# Patient Record
Sex: Male | Born: 1990 | Race: White | Hispanic: No | Marital: Single | Smoking: Current some day smoker
Health system: Southern US, Community
[De-identification: ages and names within clinical notes are randomized; demographics above are authoritative.]

## PROBLEM LIST (undated history)

## (undated) DIAGNOSIS — I319 Disease of pericardium, unspecified: Secondary | ICD-10-CM

## (undated) DIAGNOSIS — U071 COVID-19: Secondary | ICD-10-CM

---

## 2018-12-19 ENCOUNTER — Encounter: Payer: Self-pay | Admitting: Emergency Medicine

## 2018-12-19 ENCOUNTER — Emergency Department: Payer: Self-pay

## 2018-12-19 ENCOUNTER — Emergency Department
Admission: EM | Admit: 2018-12-19 | Discharge: 2018-12-19 | Disposition: A | Discharge status: Home / Self Care | Payer: Self-pay | Attending: Emergency Medicine | Admitting: Emergency Medicine

## 2018-12-19 ENCOUNTER — Other Ambulatory Visit: Payer: Self-pay

## 2018-12-19 DIAGNOSIS — Z20822 Contact with and (suspected) exposure to covid-19: Secondary | ICD-10-CM

## 2018-12-19 DIAGNOSIS — J029 Acute pharyngitis, unspecified: Secondary | ICD-10-CM

## 2018-12-19 DIAGNOSIS — R0789 Other chest pain: Secondary | ICD-10-CM

## 2018-12-19 DIAGNOSIS — B349 Viral infection, unspecified: Secondary | ICD-10-CM

## 2018-12-19 LAB — COMPREHENSIVE METABOLIC PANEL
ALT: 134 U/L — ABNORMAL HIGH (ref 0–44)
AST: 129 U/L — ABNORMAL HIGH (ref 15–41)
Albumin: 4.9 g/dL (ref 3.5–5.0)
Alkaline Phosphatase: 79 U/L (ref 38–126)
Anion gap: 19 — ABNORMAL HIGH (ref 5–15)
BUN: 11 mg/dL (ref 6–20)
CO2: 19 mmol/L — ABNORMAL LOW (ref 22–32)
Calcium: 9.3 mg/dL (ref 8.9–10.3)
Chloride: 96 mmol/L — ABNORMAL LOW (ref 98–111)
Creatinine, Ser: 0.67 mg/dL (ref 0.61–1.24)
GFR calc Af Amer: >60 mL/min (ref >60)
GFR calc non Af Amer: >60 mL/min (ref >60)
Glucose, Bld: 92 mg/dL (ref 70–99)
Potassium: 4.3 mmol/L (ref 3.5–5.1)
Sodium: 134 mmol/L — ABNORMAL LOW (ref 135–145)
Total Bilirubin: 1.1 mg/dL (ref 0.3–1.2)
Total Protein: 8.5 g/dL — ABNORMAL HIGH (ref 6.5–8.1)

## 2018-12-19 LAB — CBC WITH DIFFERENTIAL/PLATELET
Abs Immature Granulocytes: 0.02 10*3/uL (ref 0.00–0.07)
Basophils Absolute: 0 10*3/uL (ref 0.0–0.1)
Basophils Relative: 1 %
Eosinophils Absolute: 0 10*3/uL (ref 0.0–0.5)
Eosinophils Relative: 0 %
HCT: 46.4 % (ref 39.0–52.0)
Hemoglobin: 16.2 g/dL (ref 13.0–17.0)
Immature Granulocytes: 0 %
Lymphocytes Relative: 4 %
Lymphs Abs: 0.3 10*3/uL — ABNORMAL LOW (ref 0.7–4.0)
MCH: 32.4 pg (ref 26.0–34.0)
MCHC: 34.9 g/dL (ref 30.0–36.0)
MCV: 92.8 fL (ref 80.0–100.0)
Monocytes Absolute: 0 10*3/uL — ABNORMAL LOW (ref 0.1–1.0)
Monocytes Relative: 1 %
Neutro Abs: 6 10*3/uL (ref 1.7–7.7)
Neutrophils Relative %: 94 %
Platelets: 223 10*3/uL (ref 150–400)
RBC: 5 MIL/uL (ref 4.22–5.81)
RDW: 13.2 % (ref 11.5–15.5)
WBC: 6.3 10*3/uL (ref 4.0–10.5)
nRBC: 0 % (ref 0.0–0.2)

## 2018-12-19 LAB — TSH: TSH: 0.777 u[IU]/mL (ref 0.350–4.500)

## 2018-12-19 LAB — FIBRIN DERIVATIVES D-DIMER (ARMC ONLY): Fibrin derivatives D-dimer (ARMC): 252.6 ng/mL (FEU) (ref 0.00–499.00)

## 2018-12-19 LAB — LIPASE, BLOOD: Lipase: 28 U/L (ref 11–51)

## 2018-12-19 LAB — T4, FREE: Free T4: 0.76 ng/dL (ref 0.61–1.12)

## 2018-12-19 MED ORDER — METHYLPREDNISOLONE SODIUM SUCC 125 MG IJ SOLR
125.0000 mg | Freq: Once | INTRAMUSCULAR | Status: AC
  Administered 2018-12-19: 125 mg via INTRAVENOUS
  Filled 2018-12-19: qty 2

## 2018-12-19 MED ORDER — LORAZEPAM 2 MG/ML IJ SOLN
1.0000 mg | Freq: Once | INTRAMUSCULAR | Status: AC
  Administered 2018-12-19: 1 mg via INTRAVENOUS
  Filled 2018-12-19: qty 1

## 2018-12-19 MED ORDER — SODIUM CHLORIDE 0.9 % IV BOLUS
1000.0000 mL | Freq: Once | INTRAVENOUS | Status: AC
  Administered 2018-12-19: 1000 mL via INTRAVENOUS

## 2018-12-19 MED ORDER — PANTOPRAZOLE SODIUM 40 MG IV SOLR
40.0000 mg | Freq: Once | INTRAVENOUS | Status: AC
  Administered 2018-12-19: 40 mg via INTRAVENOUS
  Filled 2018-12-19: qty 40

## 2018-12-19 MED ORDER — METHYLPREDNISOLONE SODIUM SUCC 125 MG IJ SOLR
INTRAMUSCULAR | Status: AC
  Filled 2018-12-19: qty 2

## 2018-12-19 MED ORDER — METOPROLOL TARTRATE 5 MG/5ML IV SOLN
5.0000 mg | Freq: Once | INTRAVENOUS | Status: AC
  Administered 2018-12-19: 5 mg via INTRAVENOUS
  Filled 2018-12-19: qty 5

## 2018-12-19 MED ORDER — ONDANSETRON 4 MG PO TBDP: 4.0000 mg | ORAL_TABLET | Freq: Three times a day (TID) | ORAL | 0 refills | Status: DC | PRN

## 2018-12-19 MED ORDER — ONDANSETRON HCL 4 MG/2ML IJ SOLN
4.0000 mg | Freq: Once | INTRAMUSCULAR | Status: AC
  Administered 2018-12-19: 19:00:00 4 mg via INTRAVENOUS
  Filled 2018-12-19: qty 2

## 2018-12-19 MED ORDER — HYDROXYZINE HCL 25 MG PO TABS: 25.0000 mg | ORAL_TABLET | Freq: Three times a day (TID) | ORAL | 0 refills | Status: AC | PRN

## 2018-12-19 MED ORDER — LIDOCAINE VISCOUS HCL 2 % MT SOLN
15.0000 mL | Freq: Once | OROMUCOSAL | Status: AC
  Administered 2018-12-19: 15 mL via OROMUCOSAL
  Filled 2018-12-19: qty 15

## 2018-12-19 MED ORDER — FAMOTIDINE 20 MG PO TABS: 20.0000 mg | ORAL_TABLET | Freq: Two times a day (BID) | ORAL | 0 refills | Status: DC

## 2018-12-19 MED ORDER — PREDNISONE 10 MG (21) PO TBPK: ORAL_TABLET | ORAL | 0 refills | Status: DC

## 2018-12-19 MED ORDER — LIDOCAINE VISCOUS HCL 2 % MT SOLN: 20.0000 mL | OROMUCOSAL | 0 refills | Status: DC | PRN

## 2018-12-19 MED ORDER — KETOROLAC TROMETHAMINE 30 MG/ML IJ SOLN
15.0000 mg | INTRAMUSCULAR | Status: AC
  Administered 2018-12-19: 15 mg via INTRAVENOUS
  Filled 2018-12-19: qty 1

## 2018-12-19 NOTE — ED Triage Notes (Signed)
Pr reports was sitting at home eating a bag of chips and all of a sudden he felt like his throat was going to close up. Pt reports has eaten the same chip before and no new soaps, foods, clothes, etc. Pt states hx of anxiety.

## 2018-12-19 NOTE — ED Notes (Signed)
EDP Stafford notified pt remains anxious and nauseous post meds. Awaiting orders.

## 2018-12-19 NOTE — ED Notes (Signed)
EDP Stafford notified pt requesting something for nausea.

## 2018-12-19 NOTE — ED Triage Notes (Signed)
Pt comes into the ED via EMS from the [facility] parking lot with c/o feeling like his throat was closing up, no noted angioedema or rash noted. Pt has a hx of anxiety.

## 2018-12-19 NOTE — ED Notes (Signed)
Pt reports CP shifting between R and L side of chest. Pt requesting something for his CP. [place] notified. Verbal from [facility] that he will place orders for new meds as needed once imaging/bloodwork resulted.

## 2018-12-19 NOTE — ED Notes (Addendum)
See triage note. Pt denies allergies. States had covid months ago. Reports nausea/discomfort while breathing/difficulty swallowing for last 5 hours. Is currently clearing throat every 30 or so seconds. Resp unlabored/regular. A&Ox4. Pt anxious; states history of anxiety; denies taking any meds for anxiety.

## 2018-12-19 NOTE — ED Notes (Signed)
Urine sample sent to lab

## 2018-12-19 NOTE — ED Provider Notes (Signed)
[facility] Emergency Department Provider Note  ____________________________________________  Time seen: Approximately 6:39 PM  I have reviewed the triage vital signs and the nursing notes.   HISTORY  Chief Complaint Oral Swelling and Allergic Reaction    HPI Rodney Smith is a 28 y.o. male who reports being in his usual state of health until this afternoon when he was eating a bag of chips.  During that time, he felt like he had feeling of throat swelling and difficulty swallowing which was constant and worsening.  No shortness of breath.  He called EMS who brought him to the emergency department.  Denies any rash, states he did vomit once.  No known medication or food allergies.  Symptoms have been ongoing for the past 5 hours, cough unchanging.  No aggravating or alleviating factors.     History reviewed. No pertinent past medical history.   There are no problems to display for this patient.    History reviewed. No pertinent surgical history.   Prior to Admission medications   Medication Sig Start Date End Date Taking? Authorizing Provider  famotidine (PEPCID) 20 MG tablet Take 1 tablet (20 mg total) by mouth 2 (two) times daily. 12/19/18   Sharman Cheek, MD  hydrOXYzine (ATARAX/VISTARIL) 25 MG tablet Take 1 tablet (25 mg total) by mouth every 8 (eight) hours as needed for up to 5 days for anxiety. 12/19/18 12/24/18  Sharman Cheek, MD  lidocaine (XYLOCAINE) 2 % solution Use as directed 20 mLs in the mouth or throat every 2 (two) hours as needed for mouth pain. Gargle and spit out 12/19/18   Sharman Cheek, MD  ondansetron (ZOFRAN ODT) 4 MG disintegrating tablet Take 1 tablet (4 mg total) by mouth every 8 (eight) hours as needed for nausea or vomiting. 12/19/18   Sharman Cheek, MD  predniSONE (STERAPRED UNI-PAK 21 TAB) 10 MG (21) TBPK tablet 6 tablets on day 1, then 5 tablets on day 2, then 4 tablets on day 3, then 3 tablets on day 4,  then 2 tablets on day 5, then 1 tablet on day 6. 12/19/18   Sharman Cheek, MD  None   Allergies Patient has no allergy information on record.   No family history on file.  Social History Social History   Tobacco Use  . Smoking status: Not on file  Substance Use Topics  . Alcohol use: Not on file  . Drug use: Not on file  No significant alcohol or drug use.  Review of Systems  Constitutional:   No fever or chills.  ENT:   Positive sore throat.Marland Kitchen No rhinorrhea. Cardiovascular:   No chest pain or syncope. Respiratory:   No dyspnea or cough. Gastrointestinal:   Negative for abdominal pain, positive vomiting. Musculoskeletal:   Negative for focal pain or swelling All other systems reviewed and are negative except as documented above in ROS and HPI.  ____________________________________________   PHYSICAL EXAM:  VITAL SIGNS: ED Triage Vitals  Enc Vitals Group     BP 12/19/18 1658 (!) 139/103     Pulse Rate 12/19/18 1658 (!) 112     Resp 12/19/18 1658 20     Temp 12/19/18 1658 98 F (36.7 C)     Temp Source 12/19/18 1658 Oral     SpO2 12/19/18 1658 100 %     Weight 12/19/18 1655 170 lb (77.1 kg)     Height 12/19/18 1655 6' (1.829 m)     Head Circumference --  Peak Flow --      Pain Score 12/19/18 1655 0     Pain Loc --      Pain Edu? --      Excl. in [place]? --     Vital signs reviewed, nursing assessments reviewed.   Constitutional:   Alert and oriented. Non-toxic appearance.  Anxious appearing. Eyes:   Conjunctivae are normal. EOMI. PERRL. ENT      Head:   Normocephalic and atraumatic.      Nose:   Normal      Mouth/Throat:   No tongue swelling or elevation.  Tonsils and uvula normal.  No oropharyngeal edema.      Neck:   No meningismus. Full ROM. Hematological/Lymphatic/Immunilogical:   No cervical lymphadenopathy. Cardiovascular:   RRR. Symmetric bilateral radial and DP pulses.  No murmurs. Cap refill less than 2 seconds. Respiratory:   Normal  respiratory effort without tachypnea/retractions. Breath sounds are clear and equal bilaterally. No wheezes/rales/rhonchi. Gastrointestinal:   Soft and nontender. Non distended. There is no CVA tenderness.  No rebound, rigidity, or guarding. Musculoskeletal:   Normal range of motion in all extremities. No joint effusions.  No lower extremity tenderness.  No edema. Neurologic:   Normal speech and language.  Motor grossly intact. No acute focal neurologic deficits are appreciated.  Skin:    Skin is warm, dry and intact. No rash noted.  No petechiae, purpura, or bullae.  ____________________________________________    LABS (pertinent positives/negatives) (all labs ordered are listed, but only abnormal results are displayed) Labs Reviewed  COMPREHENSIVE METABOLIC PANEL - Abnormal; Notable for the following components:      Result Value   Sodium 134 (*)    Chloride 96 (*)    CO2 19 (*)    Total Protein 8.5 (*)    AST 129 (*)    ALT 134 (*)    Anion gap 19 (*)    All other components within normal limits  CBC WITH DIFFERENTIAL/PLATELET - Abnormal; Notable for the following components:   Lymphs Abs 0.3 (*)    Monocytes Absolute 0.0 (*)    All other components within normal limits  LIPASE, BLOOD  FIBRIN DERIVATIVES D-DIMER ([facility] ONLY)  T4, FREE  TSH   ____________________________________________   EKG    ____________________________________________    RADIOLOGY  DG Neck Soft Tissue  Result Date: 12/19/2018 CLINICAL DATA:  Pharyngitis with pain with swallowing after eating chips. EXAM: NECK SOFT TISSUES - 1+ VIEW COMPARISON:  None. FINDINGS: The soft tissues of the neck have normal appearances, as do the regional bones and airways period no radiopaque foreign body. No soft tissue gas. IMPRESSION: Normal examination. Electronically Signed   By: Claudie Revering M.D.   On: 12/19/2018 18:49   DG Chest Portable 1 View  Result Date: 12/19/2018 CLINICAL DATA:  Chest pain and  tachycardia EXAM: PORTABLE CHEST 1 VIEW COMPARISON:  December 09, 2008 FINDINGS: Lungs are clear. Heart size and pulmonary vascularity are normal. No adenopathy. No pneumothorax. No bone lesions. IMPRESSION: No abnormality noted. Electronically Signed   By: Lowella Grip III M.D.   On: 12/19/2018 20:49    ____________________________________________   PROCEDURES Procedures  ____________________________________________    CLINICAL IMPRESSION / ASSESSMENT AND PLAN / ED COURSE  Medications ordered in the ED: Medications  ketorolac (TORADOL) 30 MG/ML injection 15 mg (has no administration in time range)  methylPREDNISolone sodium succinate (SOLU-MEDROL) 125 mg/2 mL injection 125 mg ( Intravenous Not Given 12/19/18 1827)  pantoprazole (PROTONIX) injection 40  mg (40 mg Intravenous Given 12/19/18 1851)  lidocaine (XYLOCAINE) 2 % viscous mouth solution 15 mL (15 mLs Mouth/Throat Given 12/19/18 1842)  LORazepam (ATIVAN) injection 1 mg (1 mg Intravenous Given 12/19/18 1843)  ondansetron (ZOFRAN) injection 4 mg (4 mg Intravenous Given 12/19/18 1842)  LORazepam (ATIVAN) injection 1 mg (1 mg Intravenous Given 12/19/18 1941)  metoprolol tartrate (LOPRESSOR) injection 5 mg (5 mg Intravenous Given 12/19/18 1950)  sodium chloride 0.9 % bolus 1,000 mL (1,000 mLs Intravenous New Bag/Given 12/19/18 2043)    Pertinent labs & imaging results that were available during my care of the patient were reviewed by me and considered in my medical decision making (see chart for details).  Rodney Smith was evaluated in Emergency Department on 12/19/2018 for the symptoms described in the history of present illness. He was evaluated in the context of the global COVID-19 pandemic, which necessitated consideration that the patient might be at risk for infection with the SARS-CoV-2 virus that causes COVID-19. Institutional protocols and algorithms that pertain to the evaluation of patients at risk for COVID-19 are in  a state of rapid change based on information released by regulatory bodies including the CDC and federal and state organizations. These policies and algorithms were followed during the patient's care in the ED.   Patient presents with feeling of sore throat and throat tightening.  Anxious appearing.  Not in distress, vital signs unremarkable.  On my exam he is not tachycardic.  Presentation is not consistent with an allergic reaction given absence of skin findings or wheezing or swelling.  I doubt PTA or RPA or vascular occlusion or dissection.  I suspect pharyngeal abrasion from chips which may have been precipitated an anxiety attack.  Neck x-ray imaging viewed by me, appears normal without any appreciable swelling of the upper airway or epiglottis, no evidence of foreign body.  We will treat the patient symptomatically with viscous lidocaine, an acids, Zofran for nausea, and Ativan for anxiety.  I think he will be stable for discharge home.  Clinical Course as of Dec 18 2140  Mon Dec 19, 2018  2130 Patient reports persistent discomfort. Also now complaining of anterior chest pain that is reproducible and tender to the touch over the anterior ribs. His pain is noncardiac. Lab panel obtained which shows normal thyroid studies, normal D-dimer. Slightly elevated LFTs with lymphocytopenia. Questionable viral syndrome, possibly Covid.   [PS]  2131 Chest x-ray unremarkable. I will give NSAIDs, recommend Covid test, plan for discharge home.   [PS]  2132 Patient has a slightly decreased CO2 level on chemistry panel which is causing a mildly elevated anion gap. I think this is due to respiratory alkalosis from his hyperventilation and anxiety. Patient's been given IV fluids for hydration.   [PS]    Clinical Course User Index [PS] Sharman Cheek, MD     ----------------------------------------- 9:41 PM on 12/19/2018 -----------------------------------------  Offered patient reassurance based on  negative work-up.  His friend at bedside notes that the patient had a brief febrile illness at home 5 days ago with night sweats and chills.  In this context, the presentation is clinically suspicious for viral syndrome due to COVID-19.  The patient previously had Covid 5 months ago, is currently nontoxic with normal oxygenation so repeat testing at this time is not mandatory.  Recommended home care, fluids NSAIDs Zofran, steroid taper, hydroxyzine for anxiety.  Will give Toradol IV 50 mg in the ED while finishing his IV fluids prior to discharge.  ____________________________________________  FINAL CLINICAL IMPRESSION(S) / ED DIAGNOSES    Final diagnoses:  Sore throat  Viral syndrome  Chest wall pain  Suspected COVID-19 virus infection     ED Discharge Orders         Ordered    predniSONE (STERAPRED UNI-PAK 21 TAB) 10 MG (21) TBPK tablet     12/19/18 2140    famotidine (PEPCID) 20 MG tablet  2 times daily     12/19/18 2140    ondansetron (ZOFRAN ODT) 4 MG disintegrating tablet  Every 8 hours PRN     12/19/18 2140    lidocaine (XYLOCAINE) 2 % solution  Every 2 hours PRN     12/19/18 2140    hydrOXYzine (ATARAX/VISTARIL) 25 MG tablet  Every 8 hours PRN     12/19/18 2140          Portions of this note were generated with dragon dictation software. Dictation errors may occur despite best attempts at proofreading.   Sharman CheekStafford, Jung Yurchak, MD 12/19/18 [phone]

## 2018-12-19 NOTE — ED Notes (Signed)
This RN to bedside with meds but pt already off unit for imaging. Will give meds once pt back.

## 2019-05-12 ENCOUNTER — Inpatient Hospital Stay
Admission: EM | Admit: 2019-05-12 | Discharge: 2019-05-13 | DRG: 894 | Discharge status: Against Medical Advice | Payer: Medicaid Other | Attending: Internal Medicine | Admitting: Internal Medicine

## 2019-05-12 ENCOUNTER — Emergency Department: Payer: Self-pay

## 2019-05-12 ENCOUNTER — Other Ambulatory Visit: Payer: Self-pay

## 2019-05-12 DIAGNOSIS — E876 Hypokalemia: Secondary | ICD-10-CM | POA: Diagnosis present

## 2019-05-12 DIAGNOSIS — F17210 Nicotine dependence, cigarettes, uncomplicated: Secondary | ICD-10-CM | POA: Diagnosis present

## 2019-05-12 DIAGNOSIS — R079 Chest pain, unspecified: Secondary | ICD-10-CM

## 2019-05-12 DIAGNOSIS — I319 Disease of pericardium, unspecified: Secondary | ICD-10-CM | POA: Diagnosis present

## 2019-05-12 DIAGNOSIS — F10939 Alcohol use, unspecified with withdrawal, unspecified: Secondary | ICD-10-CM | POA: Diagnosis present

## 2019-05-12 DIAGNOSIS — Z8616 Personal history of COVID-19: Secondary | ICD-10-CM

## 2019-05-12 DIAGNOSIS — F10231 Alcohol dependence with withdrawal delirium: Secondary | ICD-10-CM

## 2019-05-12 DIAGNOSIS — Z20822 Contact with and (suspected) exposure to covid-19: Secondary | ICD-10-CM | POA: Diagnosis present

## 2019-05-12 DIAGNOSIS — R0789 Other chest pain: Secondary | ICD-10-CM | POA: Diagnosis present

## 2019-05-12 DIAGNOSIS — F10239 Alcohol dependence with withdrawal, unspecified: Principal | ICD-10-CM | POA: Diagnosis present

## 2019-05-12 DIAGNOSIS — Z8249 Family history of ischemic heart disease and other diseases of the circulatory system: Secondary | ICD-10-CM

## 2019-05-12 DIAGNOSIS — F10931 Alcohol use, unspecified with withdrawal delirium: Secondary | ICD-10-CM

## 2019-05-12 DIAGNOSIS — Z5329 Procedure and treatment not carried out because of patient's decision for other reasons: Secondary | ICD-10-CM | POA: Diagnosis present

## 2019-05-12 HISTORY — DX: COVID-19: U07.1

## 2019-05-12 HISTORY — DX: Disease of pericardium, unspecified: I31.9

## 2019-05-12 LAB — ETHANOL: Alcohol, Ethyl (B): 41 mg/dL — ABNORMAL HIGH (ref <10)

## 2019-05-12 LAB — BASIC METABOLIC PANEL
Anion gap: 16 — ABNORMAL HIGH (ref 5–15)
BUN: 10 mg/dL (ref 6–20)
CO2: 22 mmol/L (ref 22–32)
Calcium: 9.6 mg/dL (ref 8.9–10.3)
Chloride: 96 mmol/L — ABNORMAL LOW (ref 98–111)
Creatinine, Ser: 0.82 mg/dL (ref 0.61–1.24)
GFR calc Af Amer: >60 mL/min (ref >60)
GFR calc non Af Amer: >60 mL/min (ref >60)
Glucose, Bld: 94 mg/dL (ref 70–99)
Potassium: 3.2 mmol/L — ABNORMAL LOW (ref 3.5–5.1)
Sodium: 134 mmol/L — ABNORMAL LOW (ref 135–145)

## 2019-05-12 LAB — CBC
HCT: 48.8 % (ref 39.0–52.0)
Hemoglobin: 17.2 g/dL — ABNORMAL HIGH (ref 13.0–17.0)
MCH: 32.7 pg (ref 26.0–34.0)
MCHC: 35.2 g/dL (ref 30.0–36.0)
MCV: 92.8 fL (ref 80.0–100.0)
Platelets: 228 10*3/uL (ref 150–400)
RBC: 5.26 MIL/uL (ref 4.22–5.81)
RDW: 14.4 % (ref 11.5–15.5)
WBC: 7.2 10*3/uL (ref 4.0–10.5)
nRBC: 0 % (ref 0.0–0.2)

## 2019-05-12 LAB — URINE DRUG SCREEN, QUALITATIVE (ARMC ONLY)
Amphetamines, Ur Screen: NOT DETECTED
Barbiturates, Ur Screen: NOT DETECTED
Benzodiazepine, Ur Scrn: NOT DETECTED
Cannabinoid 50 Ng, Ur ~~LOC~~: NOT DETECTED
Cocaine Metabolite,Ur ~~LOC~~: NOT DETECTED
MDMA (Ecstasy)Ur Screen: NOT DETECTED
Methadone Scn, Ur: NOT DETECTED
Opiate, Ur Screen: NOT DETECTED
Phencyclidine (PCP) Ur S: NOT DETECTED
Tricyclic, Ur Screen: NOT DETECTED

## 2019-05-12 LAB — TROPONIN I (HIGH SENSITIVITY): Troponin I (High Sensitivity): <2 ng/L (ref <18)

## 2019-05-12 MED ORDER — LORAZEPAM 2 MG/ML IJ SOLN
1.0000 mg | Freq: Once | INTRAMUSCULAR | Status: AC
  Administered 2019-05-12: 23:00:00 1 mg via INTRAVENOUS
  Filled 2019-05-12: qty 1

## 2019-05-12 MED ORDER — SODIUM CHLORIDE 0.9 % IV SOLN: Freq: Once | INTRAVENOUS | Status: AC

## 2019-05-12 MED ORDER — KETOROLAC TROMETHAMINE 30 MG/ML IJ SOLN
30.0000 mg | Freq: Once | INTRAMUSCULAR | Status: AC
  Administered 2019-05-12: 30 mg via INTRAVENOUS
  Filled 2019-05-12: qty 1

## 2019-05-12 MED ORDER — FAMOTIDINE IN NACL 20-0.9 MG/50ML-% IV SOLN
20.0000 mg | Freq: Once | INTRAVENOUS | Status: AC
  Administered 2019-05-12: 20 mg via INTRAVENOUS
  Filled 2019-05-12: qty 50

## 2019-05-12 MED ORDER — LORAZEPAM 2 MG/ML IJ SOLN
1.0000 mg | Freq: Once | INTRAMUSCULAR | Status: AC
  Administered 2019-05-12: 1 mg via INTRAVENOUS
  Filled 2019-05-12: qty 1

## 2019-05-12 MED ORDER — LORAZEPAM 2 MG PO TABS
2.0000 mg | ORAL_TABLET | Freq: Once | ORAL | Status: AC
  Administered 2019-05-12: 2 mg via ORAL
  Filled 2019-05-12: qty 1

## 2019-05-12 MED ORDER — ONDANSETRON HCL 4 MG/2ML IJ SOLN
4.0000 mg | Freq: Once | INTRAMUSCULAR | Status: AC
  Administered 2019-05-12: 4 mg via INTRAVENOUS
  Filled 2019-05-12: qty 2

## 2019-05-12 NOTE — ED Notes (Signed)
Per pt- Approx 5 days ago went cold [place] from drinking alcohol. Pt reports he was drinking approx 12 pack of beer a day, sometimes liquor.

## 2019-05-12 NOTE — ED Provider Notes (Signed)
[facility] Emergency Department Provider Note       Time seen: ----------------------------------------- 10:59 PM on 05/12/2019 -----------------------------------------   I have reviewed the triage vital signs and the nursing notes.  HISTORY   Chief Complaint Chest Pain   HPI Rodney Smith is a 29 y.o. male with a history of pericarditis who presents to the ED for left-sided chest pain that started 10 minutes prior to arrival.  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  Also states he stopped drinking alcohol.  He is complains of 7 out of 10 pain in his chest that is sharp.  No past medical history on file.  There are no problems to display for this patient.   No past surgical history on file.  Allergies Patient has no known allergies.  Social History Social History   Tobacco Use  . Smoking status: Not on file  Substance Use Topics  . Alcohol use: Not on file  . Drug use: Not on file   Review of Systems Constitutional: Negative for fever. Cardiovascular: Positive for chest pain Respiratory: Negative for shortness of breath. Gastrointestinal: Negative for abdominal pain, vomiting and diarrhea. Musculoskeletal: Negative for back pain. Skin: Negative for rash. Neurological: Negative for headaches, focal weakness or numbness.  Positive for tremor  All systems negative/normal/unremarkable except as stated in the HPI  ____________________________________________   PHYSICAL EXAM:  VITAL SIGNS: ED Triage Vitals  Enc Vitals Group     BP 05/12/19 2136 (!) 149/124     Pulse Rate 05/12/19 2136 (!) 146     Resp 05/12/19 2136 (!) 22     Temp 05/12/19 2136 98.6 F (37 C)     Temp Source 05/12/19 2136 Oral     SpO2 05/12/19 2136 97 %     Weight 05/12/19 2130 180 lb (81.6 kg)     Height 05/12/19 2130 5\' 11"  (1.803 m)     Head Circumference --      Peak Flow --      Pain Score 05/12/19 2130 7     Pain Loc --      Pain Edu? --       Excl. in [place]? --     Constitutional: Alert and oriented.  Mild to moderate distress Eyes: Conjunctivae are normal. Normal extraocular movements. ENT      Head: Normocephalic and atraumatic.      Nose: No congestion/rhinnorhea.      Mouth/Throat: Mucous membranes are moist.      Neck: No stridor. Cardiovascular: Rapid rate, regular rhythm. No murmurs, rubs, or gallops. Respiratory: Normal respiratory effort without tachypnea nor retractions. Breath sounds are clear and equal bilaterally. No wheezes/rales/rhonchi. Gastrointestinal: Soft and nontender. Normal bowel sounds Musculoskeletal: Nontender with normal range of motion in extremities. No lower extremity tenderness nor edema. Neurologic:  Normal speech and language. No gross focal neurologic deficits are appreciated.  Resting tremors noted Skin:  Skin is warm, dry and intact. No rash noted. Psychiatric: Anxious mood and affect ____________________________________________  EKG: Interpreted by me.  Sinus tachycardia with short PR, rate is 156 bpm, possible septal infarct age-indeterminate, normal QT  ____________________________________________  ED COURSE:  As part of my medical decision making, I reviewed the following data within the electronic MEDICAL RECORD NUMBER History obtained from family if available, nursing notes, old chart and ekg, as well as notes from prior ED visits. Patient presented for chest pain, we will assess with labs and imaging as indicated at this time.   Procedures  Rodney Smith was evaluated in Emergency Department on 05/12/2019 for the symptoms described in the history of present illness. He was evaluated in the context of the global COVID-19 pandemic, which necessitated consideration that the patient might be at risk for infection with the SARS-CoV-2 virus that causes COVID-19. Institutional protocols and algorithms that pertain to the evaluation of patients at risk for COVID-19 are in a state of rapid  change based on information released by regulatory bodies including the CDC and federal and state organizations. These policies and algorithms were followed during the patient's care in the ED.  ____________________________________________   LABS (pertinent positives/negatives)  Labs Reviewed  BASIC METABOLIC PANEL - Abnormal; Notable for the following components:      Result Value   Sodium 134 (*)    Potassium 3.2 (*)    Chloride 96 (*)    Anion gap 16 (*)    All other components within normal limits  CBC - Abnormal; Notable for the following components:   Hemoglobin 17.2 (*)    All other components within normal limits  ETHANOL  URINE DRUG SCREEN, QUALITATIVE ([facility] ONLY)  TROPONIN I (HIGH SENSITIVITY)    RADIOLOGY Chest x-ray Does not reveal any acute process  ____________________________________________   DIFFERENTIAL DIAGNOSIS   Alcohol withdrawal, DTs, arrhythmia, pericarditis, MI, dehydration  FINAL ASSESSMENT AND PLAN  Severe alcohol withdrawal, anxiety   Plan: The patient had presented for chest pain and was found to be in alcohol withdrawal. Patient's labs thus far have not revealed any acute process. Patient's imaging were reassuring.  He was placed on CIWA protocol, given oral and IV Ativan with improvement in his symptoms.  Final disposition is pending at this time.   Laurence Aly, MD    Note: This note was generated in part or whole with voice recognition software. Voice recognition is usually quite accurate but there are transcription errors that can and very often do occur. I apologize for any typographical errors that were not detected and corrected.     Earleen Newport, MD 05/12/19 2302

## 2019-05-12 NOTE — ED Triage Notes (Signed)
Patient reports left sided chest pain that started approximately 10 minutes prior to arrival.  Patient reports history of chronic pericarditis.

## 2019-05-13 ENCOUNTER — Other Ambulatory Visit: Payer: Self-pay

## 2019-05-13 ENCOUNTER — Encounter: Payer: Self-pay | Admitting: Internal Medicine

## 2019-05-13 DIAGNOSIS — F10230 Alcohol dependence with withdrawal, uncomplicated: Secondary | ICD-10-CM

## 2019-05-13 DIAGNOSIS — F10939 Alcohol use, unspecified with withdrawal, unspecified: Secondary | ICD-10-CM | POA: Diagnosis present

## 2019-05-13 DIAGNOSIS — R0789 Other chest pain: Secondary | ICD-10-CM | POA: Diagnosis present

## 2019-05-13 DIAGNOSIS — F10239 Alcohol dependence with withdrawal, unspecified: Secondary | ICD-10-CM | POA: Diagnosis present

## 2019-05-13 LAB — CBC
HCT: 39.2 % (ref 39.0–52.0)
Hemoglobin: 14.2 g/dL (ref 13.0–17.0)
MCH: 33.2 pg (ref 26.0–34.0)
MCHC: 36.2 g/dL — ABNORMAL HIGH (ref 30.0–36.0)
MCV: 91.6 fL (ref 80.0–100.0)
Platelets: 173 10*3/uL (ref 150–400)
RBC: 4.28 MIL/uL (ref 4.22–5.81)
RDW: 14.6 % (ref 11.5–15.5)
WBC: 5.7 10*3/uL (ref 4.0–10.5)
nRBC: 0 % (ref 0.0–0.2)

## 2019-05-13 LAB — COMPREHENSIVE METABOLIC PANEL
ALT: 87 U/L — ABNORMAL HIGH (ref 0–44)
AST: 82 U/L — ABNORMAL HIGH (ref 15–41)
Albumin: 4 g/dL (ref 3.5–5.0)
Alkaline Phosphatase: 80 U/L (ref 38–126)
Anion gap: 8 (ref 5–15)
BUN: 13 mg/dL (ref 6–20)
CO2: 24 mmol/L (ref 22–32)
Calcium: 8.4 mg/dL — ABNORMAL LOW (ref 8.9–10.3)
Chloride: 105 mmol/L (ref 98–111)
Creatinine, Ser: 0.75 mg/dL (ref 0.61–1.24)
GFR calc Af Amer: >60 mL/min (ref >60)
GFR calc non Af Amer: >60 mL/min (ref >60)
Glucose, Bld: 93 mg/dL (ref 70–99)
Potassium: 3.9 mmol/L (ref 3.5–5.1)
Sodium: 137 mmol/L (ref 135–145)
Total Bilirubin: 1.2 mg/dL (ref 0.3–1.2)
Total Protein: 6.7 g/dL (ref 6.5–8.1)

## 2019-05-13 LAB — RESPIRATORY PANEL BY RT PCR (FLU A&B, COVID)
Influenza A by PCR: NEGATIVE
Influenza B by PCR: NEGATIVE
SARS Coronavirus 2 by RT PCR: NEGATIVE

## 2019-05-13 LAB — MAGNESIUM: Magnesium: 1.7 mg/dL (ref 1.7–2.4)

## 2019-05-13 LAB — GLUCOSE, CAPILLARY: Glucose-Capillary: 101 mg/dL — ABNORMAL HIGH (ref 70–99)

## 2019-05-13 LAB — PHOSPHORUS: Phosphorus: 3.9 mg/dL (ref 2.5–4.6)

## 2019-05-13 LAB — SEDIMENTATION RATE: Sed Rate: 2 mm/hr (ref 0–15)

## 2019-05-13 LAB — HIV ANTIBODY (ROUTINE TESTING W REFLEX): HIV Screen 4th Generation wRfx: NONREACTIVE

## 2019-05-13 LAB — TROPONIN I (HIGH SENSITIVITY): Troponin I (High Sensitivity): <2 ng/L (ref <18)

## 2019-05-13 MED ORDER — POTASSIUM CHLORIDE 20 MEQ PO PACK
40.0000 meq | PACK | Freq: Once | ORAL | Status: AC
  Administered 2019-05-13: 40 meq via ORAL
  Filled 2019-05-13: qty 2

## 2019-05-13 MED ORDER — ACETAMINOPHEN 325 MG PO TABS
650.0000 mg | ORAL_TABLET | Freq: Four times a day (QID) | ORAL | Status: DC
  Administered 2019-05-13: 11:00:00 650 mg via ORAL
  Filled 2019-05-13: qty 2

## 2019-05-13 MED ORDER — ACETAMINOPHEN 325 MG PO TABS: 650.0000 mg | ORAL_TABLET | Freq: Four times a day (QID) | ORAL | Status: DC | PRN

## 2019-05-13 MED ORDER — POTASSIUM CHLORIDE IN NACL 20-0.9 MEQ/L-% IV SOLN
INTRAVENOUS | Status: DC
  Filled 2019-05-13 (×6): qty 1000

## 2019-05-13 MED ORDER — LORAZEPAM 1 MG PO TABS
1.0000 mg | ORAL_TABLET | ORAL | Status: DC | PRN
  Administered 2019-05-13 (×2): 1 mg via ORAL
  Administered 2019-05-13 (×2): 2 mg via ORAL
  Filled 2019-05-13 (×2): qty 2
  Filled 2019-05-13 (×2): qty 1

## 2019-05-13 MED ORDER — ONDANSETRON HCL 4 MG PO TABS: 4.0000 mg | ORAL_TABLET | Freq: Four times a day (QID) | ORAL | Status: DC | PRN

## 2019-05-13 MED ORDER — KETOROLAC TROMETHAMINE 30 MG/ML IJ SOLN: 30.0000 mg | Freq: Four times a day (QID) | INTRAMUSCULAR | Status: DC | PRN

## 2019-05-13 MED ORDER — ACETAMINOPHEN 650 MG RE SUPP: 650.0000 mg | Freq: Four times a day (QID) | RECTAL | Status: DC | PRN

## 2019-05-13 MED ORDER — HYDROCODONE-ACETAMINOPHEN 5-325 MG PO TABS
1.0000 | ORAL_TABLET | ORAL | Status: DC | PRN
  Administered 2019-05-13: 2 via ORAL
  Filled 2019-05-13: qty 2

## 2019-05-13 MED ORDER — OXYCODONE HCL 5 MG PO TABS
10.0000 mg | ORAL_TABLET | ORAL | Status: DC | PRN
  Administered 2019-05-13: 10 mg via ORAL
  Filled 2019-05-13: qty 2

## 2019-05-13 MED ORDER — METOPROLOL TARTRATE 5 MG/5ML IV SOLN: 5.0000 mg | Freq: Four times a day (QID) | INTRAVENOUS | Status: DC | PRN

## 2019-05-13 MED ORDER — KETOROLAC TROMETHAMINE 30 MG/ML IJ SOLN: 15.0000 mg | Freq: Four times a day (QID) | INTRAMUSCULAR | Status: DC | PRN

## 2019-05-13 MED ORDER — CHLORDIAZEPOXIDE HCL 25 MG PO CAPS
25.0000 mg | ORAL_CAPSULE | Freq: Four times a day (QID) | ORAL | Status: DC
  Administered 2019-05-13 (×2): 25 mg via ORAL
  Filled 2019-05-13 (×2): qty 1

## 2019-05-13 MED ORDER — OXYCODONE HCL 5 MG PO TABS: 5.0000 mg | ORAL_TABLET | ORAL | Status: DC | PRN

## 2019-05-13 MED ORDER — THIAMINE HCL 100 MG PO TABS
100.0000 mg | ORAL_TABLET | Freq: Every day | ORAL | Status: DC
  Administered 2019-05-13: 100 mg via ORAL
  Filled 2019-05-13: qty 1

## 2019-05-13 MED ORDER — LORAZEPAM 2 MG/ML IJ SOLN: 1.0000 mg | INTRAMUSCULAR | Status: DC | PRN

## 2019-05-13 MED ORDER — ONDANSETRON HCL 4 MG/2ML IJ SOLN: 4.0000 mg | Freq: Four times a day (QID) | INTRAMUSCULAR | Status: DC | PRN

## 2019-05-13 MED ORDER — ENOXAPARIN SODIUM 40 MG/0.4ML ~~LOC~~ SOLN
40.0000 mg | SUBCUTANEOUS | Status: DC
  Administered 2019-05-13: 40 mg via SUBCUTANEOUS
  Filled 2019-05-13: qty 0.4

## 2019-05-13 MED ORDER — KETOROLAC TROMETHAMINE 30 MG/ML IJ SOLN
30.0000 mg | Freq: Three times a day (TID) | INTRAMUSCULAR | Status: DC
  Administered 2019-05-13: 30 mg via INTRAVENOUS
  Filled 2019-05-13: qty 1

## 2019-05-13 MED ORDER — ADULT MULTIVITAMIN W/MINERALS CH
1.0000 | ORAL_TABLET | Freq: Every day | ORAL | Status: DC
  Administered 2019-05-13: 09:00:00 1 via ORAL
  Filled 2019-05-13: qty 1

## 2019-05-13 MED ORDER — MORPHINE SULFATE (PF) 2 MG/ML IV SOLN: 2.0000 mg | INTRAVENOUS | Status: DC | PRN

## 2019-05-13 MED ORDER — FOLIC ACID 1 MG PO TABS
1.0000 mg | ORAL_TABLET | Freq: Every day | ORAL | Status: DC
  Administered 2019-05-13: 09:00:00 1 mg via ORAL
  Filled 2019-05-13: qty 1

## 2019-05-13 MED ORDER — PREDNISONE 20 MG PO TABS
20.0000 mg | ORAL_TABLET | Freq: Every day | ORAL | Status: DC
  Administered 2019-05-13: 20 mg via ORAL
  Filled 2019-05-13: qty 1

## 2019-05-13 MED ORDER — THIAMINE HCL 100 MG/ML IJ SOLN: 100.0000 mg | Freq: Every day | INTRAMUSCULAR | Status: DC

## 2019-05-13 MED ORDER — SENNOSIDES-DOCUSATE SODIUM 8.6-50 MG PO TABS: 1.0000 | ORAL_TABLET | Freq: Every evening | ORAL | Status: DC | PRN

## 2019-05-13 NOTE — Progress Notes (Addendum)
  PROGRESS NOTE    Rodney Smith  JFH:[id number] DOB: [birth year] DOA: 05/12/2019  PCP: Patient, No Pcp Per    LOS - 0    Patient admitted overnight with acute alcohol withdrawal and complaints of chest pain.  BP was 149/124 initially, with HR 149 in the ED.  He developed pericarditis after having Covid-19 infection last year, and has intermittent episodes of chest pain since.  Negative troponin and no ischemic ECG changes.  His last drink was 2-3 days prior to admission.  Being treated with Ativan per CIWA protocol.  HR and BP now stable and within normal.  Interval subjective: RN reported patient requesting Dilaudid bc Toradol and Norco not helping, but he had not received Norco since overnight, 8 hours ago.    Exam: no acute distress, heart RRR, lungs clear, no edema, abdomen nontender nondistended with +bowel sounds  I have reviewed the full H&P by Dr. Adline Potter in detail, and I agree with the assessment and plan as outlined therein. In addition: --add Librium scheduled 25 mg PO QID, taper by one dose daily --d/c Lopressor, tachycardia resolved with treating withdrawal --decrease fluid rate, stop later if eating/drinking today --Echo ordered to make sure no pericardial effusion or other findings to explain worsened chest pain --changed pain regimen: scheduled Tylenol and Toradol, PRN oxy 5 for moderate, oxy 10 for severe, morphine for breakthrough pain only --added Prednisone 20 mg --scale back narcotics as quickly as possible   Time Spent: 20 minutes  ADDENDUM: patient left the hospital AGAINST MEDICAL ADVICE this afternoon around 3:45pm.  He was alert and fully oriented and had capacity to make this decision at the time.     Pennie Banter, DO [facility]   If 7PM-7AM, please contact night-coverage [email or web address] 05/13/2019, 8:44 AM

## 2019-05-13 NOTE — H&P (Signed)
History and Physical    Rodney Smith CNO:[id number] DOB: 10/31/1990 DOA: 05/12/2019  PCP: Patient, No Pcp Per   Patient coming from: Home  I have personally briefly reviewed patient's old medical records in [facility]  Chief Complaint: Chest pain, tremors   HPI: Rodney Smith is a 29 y.o. male with medical history significant of alcohol abuse, COVID-19 diagnosed last year and patient says that he subsequently had pericarditis which was chronic and has been having on and off chest pain since then.  Pain comes to the ER complains of on and off chest pain and because he is not able to make the pain is drinking daily.  He is drinking 12 pack of beer daily.  Last alcohol usage was 2 to 3 days ago as per the patient.  He rates the pain as 6/10 in severity sharp pain in the middle of the chest, no radiation, no greater factors, led by pain medications given in the ER.  He denies any fevers, chills.  Denies any coughing or any sputum production.  Denies any abdominal pain.  Did complain of some nausea.  No vomitings.  Denies any diarrhea or any constipation.  Denies any urinary complaints.  Patient denies any hallucinations.  ED Course: Patient was found to be tachycardic in the ER with heart rate around 140s to 150s, was given IV fluids, IV Ativan with improvement.  Review of Systems: Ten point review of systems reviewed  in detail and negative except as mentioned above in the HPI.   Past Medical History:  Diagnosis Date  . COVID-19   . Pericarditis     History reviewed. No pertinent surgical history.  Was reviewed and was noncontributory  Social History  reports that he has been smoking cigarettes. He has never used smokeless tobacco. He reports current alcohol use of about 84.0 standard drinks of alcohol per week. He reports that he does not use drugs.  No Known Allergies  Family History  Problem Relation Age of Onset  . Heart attack Father   . CAD Paternal Grandfather        Prior to Admission medications   Not on File    Physical Exam: Vitals:   05/12/19 2200 05/12/19 2230 05/12/19 2300 05/12/19 2330  BP: (!) 161/125 (!) 156/97 138/89 (!) 140/97  Pulse: (!) 116 (!) 125 (!) 109 (!) 104  Resp:  (!) 21 14 18   Temp:      TempSrc:      SpO2:  97% 96% 99%  Weight:      Height:        Constitutional: Patient lying in the bed in no acute distress, he is calm and comfortable Vitals:   05/12/19 2200 05/12/19 2230 05/12/19 2300 05/12/19 2330  BP: (!) 161/125 (!) 156/97 138/89 (!) 140/97  Pulse: (!) 116 (!) 125 (!) 109 (!) 104  Resp:  (!) 21 14 18   Temp:      TempSrc:      SpO2:  97% 96% 99%  Weight:      Height:       Eyes: PERRL, lids normal, No pallor, No icterus ENMT: Mucous membranes are moist. Posterior pharynx clear of any exudate or lesions.Normal dentition.  Neck: normal, supple, no masses, no thyromegaly Respiratory: clear to auscultation bilaterally, no wheezing, no crackles. Normal respiratory effort. No accessory muscle use.  Cardiovascular: S1 S2 Heard, rate and rhythm regular. No extremity edema. 2+ pedal pulses. No carotid bruits.  Abdomen: Soft, no tenderness, non  distended, no masses palpated. Bowel sounds positive.  Musculoskeletal: no clubbing / cyanosis. No joint deformity upper and lower extremities. Good ROM, no contractures. Normal muscle tone.  Skin: warm and dry. no rashes noted on limited skin examination. Neurologic: CN 2-12 grossly intact. Sensation intact, DTR normal. Strength 5/5 in all 4.  Tremors noted on extension of his arms/hands Psychiatric: Normal judgment and insight. Alert and oriented x 3.  Anxious   Labs on Admission: I have personally reviewed following labs and imaging studies  CBC: Recent Labs  Lab 05/12/19 2140  WBC 7.2  HGB 17.2*  HCT 48.8  MCV 92.8  PLT 937    Basic Metabolic Panel: Recent Labs  Lab 05/12/19 2140  NA 134*  K 3.2*  CL 96*  CO2 22  GLUCOSE 94  BUN 10  CREATININE 0.82   CALCIUM 9.6    GFR: Estimated Creatinine Clearance: 142.8 mL/min (by C-G formula based on SCr of 0.82 mg/dL).  Liver Function Tests: No results for input(s): AST, ALT, ALKPHOS, BILITOT, PROT, ALBUMIN in the last 168 hours.  Urine analysis: No results found for: COLORURINE, APPEARANCEUR, [place], [place], GLUCOSEU, HGBUR, BILIRUBINUR, KETONESUR, PROTEINUR, UROBILINOGEN, NITRITE, LEUKOCYTESUR  Radiological Exams on Admission: DG Chest 2 View  Result Date: 05/12/2019 CLINICAL DATA:  Left-sided chest pain for 10 minutes, history of pericarditis EXAM: CHEST - 2 VIEW COMPARISON:  12/19/2018 FINDINGS: Frontal and lateral views of the chest demonstrate an unremarkable cardiac silhouette. No airspace disease, effusion, or pneumothorax. No acute bony abnormalities. IMPRESSION: 1. No acute intrathoracic process. Electronically Signed   By: Randa Ngo M.D.   On: 05/12/2019 21:52    EKG: Independently reviewed.  Initial EKG showed sinus tachycardia at 156 bpm, with PVCs.  Assessment/Plan Active Problems:   Alcohol withdrawal (HCC)   Alcohol withdrawal syndrome, POA -We will admit to the MedSurg floor with telemetry monitoring -We will be on the CIWA protocol with Ativan -We will also be on thiamine, folic acid and multivitamins -IV fluid hydration -Patient willing to quit drinking alcohol.  He says that because of his pain is going back to drinking alcohol again.  Might benefit from outpatient pain management referral.  Chronic on and off chest pain, POA Chronic pericarditis as per the patient, POA -Hydrocodone and Toradol as needed for pain -Troponins checked in the ER were negative, no acute changes noted on EKG  Hypokalemia, POA -Potassium being replaced as needed IV   DVT prophylaxis: Subcu Lovenox Code Status: Full code Family Communication: No family available at the bedside Disposition Plan: Home Consults called: None Admission status: Inpatient, MedSurg with cardiac  monitoring  Severity of Illness: The appropriate patient status for this patient is INPATIENT. Inpatient status is judged to be reasonable and necessary in order to provide the required intensity of service to ensure the patient's safety. The patient's presenting symptoms, physical exam findings, and initial radiographic and laboratory data in the context of their chronic comorbidities is felt to place them at high risk for further clinical deterioration. Furthermore, it is not anticipated that the patient will be medically stable for discharge from the hospital within 2 midnights of admission. The following factors support the patient status of inpatient.   " The patient's presenting symptoms include chest pain, tremors, weakness. " The worrisome physical exam findings include alcohol withdrawal signs and symptoms " The initial radiographic and laboratory data are worrisome because of tachycardia noted on EKG. " The chronic co-morbidities include alcohol abuse   * I certify that at  the point of admission it is my clinical judgment that the patient will require inpatient hospital care spanning beyond 2 midnights from the point of admission due to high intensity of service, high risk for further deterioration and high frequency of surveillance required.Arelia Sneddon MD [facility]  How to contact the [facility] Attending or Consulting provider 7A - 7P or covering provider during after hours 7P -7A, for this patient?   1. Check the care team in [facility] and look for a) attending/consulting TR[facility] provider listed and b) the [facility] team listed 2. Log into [email or web address] and use [place]'s universal password to access. If you do not have the password, please contact the hospital operator. 3. Locate the [facility] provider you are looking for under [facility] and page to a number that you can be directly reached. 4. If you still have difficulty reaching the provider, please page the [facility] (Director on  Call) for the Hospitalists listed on amion for assistance.  05/13/2019, 1:05 AM

## 2019-05-13 NOTE — Progress Notes (Signed)
Rodney Smith and O x4. VSS. Pt tolerating diet well. No complaints of nausea or vomiting. IV removed intact, Patient leaving Against medical advice. Patient stated he "is fine now" he was ready to go. Patient left room walking    Vitals:   05/13/19 0747 05/13/19 1226  BP: 127/74 (!) 140/100  Pulse: 94 96  Resp: 14 20  Temp: 98.2 F (36.8 C) 98.4 F (36.9 C)  SpO2: 98% 100%    Rodney Smith

## 2019-05-14 ENCOUNTER — Other Ambulatory Visit: Payer: Self-pay

## 2019-05-14 ENCOUNTER — Encounter: Payer: Self-pay | Admitting: Emergency Medicine

## 2019-05-14 ENCOUNTER — Inpatient Hospital Stay
Admission: EM | Admit: 2019-05-14 | Discharge: 2019-05-16 | DRG: 896 | Disposition: A | Discharge status: Home / Self Care | Payer: Medicaid Other | Attending: Internal Medicine | Admitting: Internal Medicine

## 2019-05-14 DIAGNOSIS — I319 Disease of pericardium, unspecified: Secondary | ICD-10-CM | POA: Diagnosis present

## 2019-05-14 DIAGNOSIS — G9341 Metabolic encephalopathy: Secondary | ICD-10-CM | POA: Diagnosis not present

## 2019-05-14 DIAGNOSIS — F10939 Alcohol use, unspecified with withdrawal, unspecified: Secondary | ICD-10-CM | POA: Diagnosis present

## 2019-05-14 DIAGNOSIS — E876 Hypokalemia: Secondary | ICD-10-CM | POA: Diagnosis not present

## 2019-05-14 DIAGNOSIS — F17210 Nicotine dependence, cigarettes, uncomplicated: Secondary | ICD-10-CM | POA: Diagnosis present

## 2019-05-14 DIAGNOSIS — R443 Hallucinations, unspecified: Secondary | ICD-10-CM | POA: Diagnosis present

## 2019-05-14 DIAGNOSIS — R945 Abnormal results of liver function studies: Secondary | ICD-10-CM | POA: Diagnosis present

## 2019-05-14 DIAGNOSIS — F10230 Alcohol dependence with withdrawal, uncomplicated: Secondary | ICD-10-CM

## 2019-05-14 DIAGNOSIS — R7989 Other specified abnormal findings of blood chemistry: Secondary | ICD-10-CM | POA: Diagnosis present

## 2019-05-14 DIAGNOSIS — D6959 Other secondary thrombocytopenia: Secondary | ICD-10-CM | POA: Diagnosis present

## 2019-05-14 DIAGNOSIS — I610 Nontraumatic intracerebral hemorrhage in hemisphere, subcortical: Secondary | ICD-10-CM | POA: Diagnosis present

## 2019-05-14 DIAGNOSIS — F10239 Alcohol dependence with withdrawal, unspecified: Secondary | ICD-10-CM | POA: Diagnosis present

## 2019-05-14 DIAGNOSIS — G9340 Encephalopathy, unspecified: Secondary | ICD-10-CM | POA: Diagnosis present

## 2019-05-14 DIAGNOSIS — R000 Tachycardia, unspecified: Secondary | ICD-10-CM

## 2019-05-14 DIAGNOSIS — F10931 Alcohol use, unspecified with withdrawal delirium: Secondary | ICD-10-CM

## 2019-05-14 DIAGNOSIS — Z8616 Personal history of COVID-19: Secondary | ICD-10-CM

## 2019-05-14 DIAGNOSIS — B1920 Unspecified viral hepatitis C without hepatic coma: Secondary | ICD-10-CM | POA: Diagnosis present

## 2019-05-14 DIAGNOSIS — F10231 Alcohol dependence with withdrawal delirium: Principal | ICD-10-CM | POA: Diagnosis present

## 2019-05-14 LAB — URINE DRUG SCREEN, QUALITATIVE (ARMC ONLY)
Amphetamines, Ur Screen: NOT DETECTED
Barbiturates, Ur Screen: NOT DETECTED
Benzodiazepine, Ur Scrn: POSITIVE — AB
Cannabinoid 50 Ng, Ur ~~LOC~~: NOT DETECTED
Cocaine Metabolite,Ur ~~LOC~~: NOT DETECTED
MDMA (Ecstasy)Ur Screen: NOT DETECTED
Methadone Scn, Ur: NOT DETECTED
Opiate, Ur Screen: NOT DETECTED
Phencyclidine (PCP) Ur S: NOT DETECTED
Tricyclic, Ur Screen: NOT DETECTED

## 2019-05-14 LAB — COMPREHENSIVE METABOLIC PANEL
ALT: 99 U/L — ABNORMAL HIGH (ref 0–44)
AST: 94 U/L — ABNORMAL HIGH (ref 15–41)
Albumin: 4.8 g/dL (ref 3.5–5.0)
Alkaline Phosphatase: 81 U/L (ref 38–126)
Anion gap: 15 (ref 5–15)
BUN: <5 mg/dL — ABNORMAL LOW (ref 6–20)
CO2: 19 mmol/L — ABNORMAL LOW (ref 22–32)
Calcium: 9.4 mg/dL (ref 8.9–10.3)
Chloride: 106 mmol/L (ref 98–111)
Creatinine, Ser: 0.63 mg/dL (ref 0.61–1.24)
GFR calc Af Amer: >60 mL/min (ref >60)
GFR calc non Af Amer: >60 mL/min (ref >60)
Glucose, Bld: 94 mg/dL (ref 70–99)
Potassium: 3.3 mmol/L — ABNORMAL LOW (ref 3.5–5.1)
Sodium: 140 mmol/L (ref 135–145)
Total Bilirubin: 0.9 mg/dL (ref 0.3–1.2)
Total Protein: 7.7 g/dL (ref 6.5–8.1)

## 2019-05-14 LAB — CBC
HCT: 42.3 % (ref 39.0–52.0)
Hemoglobin: 14.7 g/dL (ref 13.0–17.0)
MCH: 32.5 pg (ref 26.0–34.0)
MCHC: 34.8 g/dL (ref 30.0–36.0)
MCV: 93.6 fL (ref 80.0–100.0)
Platelets: 179 10*3/uL (ref 150–400)
RBC: 4.52 MIL/uL (ref 4.22–5.81)
RDW: 14.9 % (ref 11.5–15.5)
WBC: 9.4 10*3/uL (ref 4.0–10.5)
nRBC: 0 % (ref 0.0–0.2)

## 2019-05-14 LAB — MAGNESIUM: Magnesium: 1.5 mg/dL — ABNORMAL LOW (ref 1.7–2.4)

## 2019-05-14 LAB — RESPIRATORY PANEL BY RT PCR (FLU A&B, COVID)
Influenza A by PCR: NEGATIVE
Influenza B by PCR: NEGATIVE
SARS Coronavirus 2 by RT PCR: NEGATIVE

## 2019-05-14 LAB — PHOSPHORUS: Phosphorus: 3 mg/dL (ref 2.5–4.6)

## 2019-05-14 LAB — ETHANOL: Alcohol, Ethyl (B): 102 mg/dL — ABNORMAL HIGH (ref <10)

## 2019-05-14 LAB — SALICYLATE LEVEL: Salicylate Lvl: <7 mg/dL — ABNORMAL LOW (ref 7.0–30.0)

## 2019-05-14 LAB — ACETAMINOPHEN LEVEL: Acetaminophen (Tylenol), Serum: <10 ug/mL — ABNORMAL LOW (ref 10–30)

## 2019-05-14 MED ORDER — POTASSIUM CHLORIDE CRYS ER 20 MEQ PO TBCR
40.0000 meq | EXTENDED_RELEASE_TABLET | Freq: Once | ORAL | Status: AC
  Administered 2019-05-14: 15:00:00 40 meq via ORAL
  Filled 2019-05-14: qty 2

## 2019-05-14 MED ORDER — SODIUM CHLORIDE 0.9 % IV BOLUS
1000.0000 mL | Freq: Once | INTRAVENOUS | Status: AC
  Administered 2019-05-14: 1000 mL via INTRAVENOUS

## 2019-05-14 MED ORDER — CHLORDIAZEPOXIDE HCL 10 MG PO CAPS
10.0000 mg | ORAL_CAPSULE | Freq: Three times a day (TID) | ORAL | Status: DC
  Administered 2019-05-14 – 2019-05-16 (×5): 10 mg via ORAL
  Filled 2019-05-14 (×5): qty 1

## 2019-05-14 MED ORDER — ONDANSETRON HCL 4 MG/2ML IJ SOLN: 4.0000 mg | Freq: Three times a day (TID) | INTRAMUSCULAR | Status: DC | PRN

## 2019-05-14 MED ORDER — LORAZEPAM 2 MG PO TABS: 0.0000 mg | ORAL_TABLET | Freq: Four times a day (QID) | ORAL | Status: AC

## 2019-05-14 MED ORDER — HALOPERIDOL LACTATE 5 MG/ML IJ SOLN
5.0000 mg | INTRAMUSCULAR | Status: AC
  Administered 2019-05-14: 22:00:00 5 mg via INTRAVENOUS
  Filled 2019-05-14: qty 1

## 2019-05-14 MED ORDER — SUCRALFATE 1 G PO TABS
1.0000 g | ORAL_TABLET | Freq: Three times a day (TID) | ORAL | Status: DC
  Administered 2019-05-14 – 2019-05-16 (×6): 1 g via ORAL
  Filled 2019-05-14 (×6): qty 1

## 2019-05-14 MED ORDER — IBUPROFEN 400 MG PO TABS: 200.0000 mg | ORAL_TABLET | Freq: Four times a day (QID) | ORAL | Status: DC | PRN

## 2019-05-14 MED ORDER — NICOTINE 21 MG/24HR TD PT24
21.0000 mg | MEDICATED_PATCH | Freq: Every day | TRANSDERMAL | Status: DC
  Administered 2019-05-14 – 2019-05-16 (×3): 21 mg via TRANSDERMAL
  Filled 2019-05-14 (×3): qty 1

## 2019-05-14 MED ORDER — LORAZEPAM 2 MG PO TABS: 0.0000 mg | ORAL_TABLET | Freq: Two times a day (BID) | ORAL | Status: DC

## 2019-05-14 MED ORDER — THIAMINE HCL 100 MG PO TABS
100.0000 mg | ORAL_TABLET | Freq: Every day | ORAL | Status: DC
  Administered 2019-05-15 – 2019-05-16 (×2): 100 mg via ORAL
  Filled 2019-05-14 (×2): qty 1

## 2019-05-14 MED ORDER — SODIUM CHLORIDE 0.9 % IV SOLN: INTRAVENOUS | Status: DC

## 2019-05-14 MED ORDER — LORAZEPAM 2 MG/ML IJ SOLN
0.0000 mg | Freq: Two times a day (BID) | INTRAMUSCULAR | Status: DC
  Administered 2019-05-14: 18:00:00 4 mg via INTRAVENOUS

## 2019-05-14 MED ORDER — LORAZEPAM 2 MG/ML IJ SOLN
2.0000 mg | Freq: Once | INTRAMUSCULAR | Status: AC
  Administered 2019-05-14: 2 mg via INTRAVENOUS

## 2019-05-14 MED ORDER — THIAMINE HCL 100 MG/ML IJ SOLN
100.0000 mg | Freq: Every day | INTRAMUSCULAR | Status: DC
  Administered 2019-05-14: 100 mg via INTRAVENOUS
  Filled 2019-05-14: qty 2

## 2019-05-14 MED ORDER — ENOXAPARIN SODIUM 40 MG/0.4ML ~~LOC~~ SOLN
40.0000 mg | SUBCUTANEOUS | Status: DC
  Administered 2019-05-15: 23:00:00 40 mg via SUBCUTANEOUS
  Filled 2019-05-14: qty 0.4

## 2019-05-14 MED ORDER — LORAZEPAM 2 MG/ML IJ SOLN
0.0000 mg | Freq: Four times a day (QID) | INTRAMUSCULAR | Status: AC
  Administered 2019-05-14 – 2019-05-15 (×3): 2 mg via INTRAVENOUS
  Administered 2019-05-15: 17:00:00 1 mg via INTRAVENOUS
  Administered 2019-05-15 – 2019-05-16 (×2): 2 mg via INTRAVENOUS
  Filled 2019-05-14 (×5): qty 1
  Filled 2019-05-14: qty 2
  Filled 2019-05-14 (×2): qty 1

## 2019-05-14 NOTE — ED Notes (Signed)
RN has called [facility] and informed them of patient requiring sitter. Sitter will be available after 1900. RN will try to call report again.

## 2019-05-14 NOTE — ED Notes (Signed)
TTS unable to assess at this time. 

## 2019-05-14 NOTE — ED Notes (Signed)
Lupita Leash, floor called for pt coming, reports will let Tamika know

## 2019-05-14 NOTE — ED Triage Notes (Signed)
Pt to ED via [facility], [place], pt believed to be naked. Per BD pt was talking to [place] and Jesus and bringing people back. Pt told BD that he had a "stick" that could help with transport. Pt said the stick helped him get here from [place]. Pt is cooperative at this time.

## 2019-05-14 NOTE — ED Notes (Signed)
Patient states he is seeing and talking to snakes in the top corner of the room. Patient at this time is calm, cool and collect. Patient is not aggressive and is cooperative at this time.

## 2019-05-14 NOTE — ED Notes (Signed)
Patient brought in from triage dressed out with [facility].  Patient has no belongings.

## 2019-05-14 NOTE — H&P (Addendum)
History and Physical    Rodney Smith OEV:[id number] DOB: [birth year] DOA: 05/14/2019  Referring MD/NP/PA:   PCP: Rodney Smith   Patient coming from:  The patient is coming from home.  At baseline, pt is independent for most of ADL.        Chief Complaint: Altered mental status  HPI: Rodney Smith is a 29 y.o. male with medical history significant of pericarditis after having Covid-19 infection last year, alcohol abuse, tobacco abuse, who presents with altered mental status.  Pt was admitted to hospital yesterday due to alcohol withdrawal and chest pain 2/2 possible pericarditis.  Patient left hospital AMA. Smith report, pt was found in the woood in his apartment complex, possibly naked. He was talking to Rodney Smith. Pt said the stick helped him get here from Rodney Smith.  He has hallucination and is seeing snakes in the room.  He denies suicidal or homicidal ideations.  Patient denies chest pain, cough, shortness breath, fever, chills.  No nausea, vomiting, diarrhea, abdominal pain, symptoms of UTI.  He moves all extremities normally.  ED Course: pt was found to have WBC 9.4, UDS positive for benzo, Tylenol level less than 10, salicylate level less than 7, negative COVID-19 PCR, alcohol level 102, potassium 3.3, renal function okay, temperature normal, tachycardia with heart rate of 130s, oxygen saturation 94-100% on room air, blood pressure 137/89.  Patient had a negative chest x-ray on 5/7.  Patient is placed on MedSurg bed for observation.  Psychiatry is consulted.  Review of Systems:   General: no fevers, chills, no body weight gain, has fatigue HEENT: no blurry vision, hearing changes or sore throat Respiratory: no dyspnea, coughing, wheezing CV: no chest pain, no palpitations GI: no nausea, vomiting, abdominal pain, diarrhea, constipation GU: no dysuria, burning on urination, increased urinary frequency, hematuria  Ext: no leg edema Neuro: no unilateral weakness, numbness, or  tingling, no vision change or hearing loss. Has AMS. Skin: no rash, no skin tear. MSK: No muscle spasm, no deformity, no limitation of range of movement in spin Heme: No easy bruising.  Travel history: No recent long distant travel. Psychiatry: Has hallucination  Allergy: No Known Allergies  Past Medical History:  Diagnosis Date  . COVID-19   . Pericarditis     History reviewed. No pertinent surgical history.  Social History:  reports that he has been smoking cigarettes. He has never used smokeless tobacco. He reports current alcohol use of about 84.0 standard drinks of alcohol Smith week. He reports that he does not use drugs.  Family History:  Family History  Problem Relation Age of Onset  . Heart attack Father   . CAD Paternal Grandfather      Prior to Admission medications   Not on File    Physical Exam: Vitals:   05/14/19 1344 05/14/19 1502 05/14/19 1503 05/14/19 1649  BP:  122/69 122/69 114/75  Pulse:  (!) 115 (!) 115 (!) 116  Resp:  19  19  Temp:      TempSrc:      SpO2:  100%    Weight: 81 kg     Height: 5\' 11"  (1.803 m)      General: Not in acute distress HEENT:       Eyes: PERRL, EOMI, no scleral icterus.       ENT: No discharge from the ears and nose, no pharynx injection, no tonsillar enlargement.        Neck: No JVD, no bruit, no mass felt.  Heme: No neck lymph node enlargement. Cardiac: S1/S2, RRR, No murmurs, No gallops or rubs. Respiratory: No rales, wheezing, rhonchi or rubs. GI: Soft, nondistended, nontender, no rebound pain, no organomegaly, BS present. GU: No hematuria Ext: No pitting leg edema bilaterally. 2+DP/PT pulse bilaterally. Musculoskeletal: No joint deformities, No joint redness or warmth, no limitation of ROM in spin. Skin: No rashes.  Neuro: confused, but is still oriented X3, cranial nerves II-XII grossly intact, moves all extremities normally. Psych: Patient has hallucination, no suicidal or hemocidal ideation.  Labs on  Admission: I have personally reviewed following labs and imaging studies  CBC: Recent Labs  Lab 05/12/19 2140 05/13/19 0549 05/14/19 1348  WBC 7.2 5.7 9.4  HGB 17.2* 14.2 14.7  HCT 48.8 39.2 42.3  MCV 92.8 91.6 93.6  PLT 228 173 179   Basic Metabolic Panel: Recent Labs  Lab 05/12/19 2140 05/13/19 0549 05/14/19 1348  NA 134* 137 140  K 3.2* 3.9 3.3*  CL 96* 105 106  CO2 22 24 19*  GLUCOSE 94 93 94  BUN 10 13 <5*  CREATININE 0.82 0.75 0.63  CALCIUM 9.6 8.4* 9.4  MG  --  1.7  --   PHOS  --  3.9  --    GFR: Estimated Creatinine Clearance: 146.4 mL/min (by C-G formula based on SCr of 0.63 mg/dL). Liver Function Tests: Recent Labs  Lab 05/13/19 0549 05/14/19 1348  AST 82* 94*  ALT 87* 99*  ALKPHOS 80 81  BILITOT 1.2 0.9  PROT 6.7 7.7  ALBUMIN 4.0 4.8   No results for input(s): LIPASE, AMYLASE in the last 168 hours. No results for input(s): AMMONIA in the last 168 hours. Coagulation Profile: No results for input(s): INR, PROTIME in the last 168 hours. Cardiac Enzymes: No results for input(s): CKTOTAL, CKMB, CKMBINDEX, TROPONINI in the last 168 hours. BNP (last 3 results) No results for input(s): PROBNP in the last 8760 hours. HbA1C: No results for input(s): HGBA1C in the last 72 hours. CBG: Recent Labs  Lab 05/13/19 0748  GLUCAP 101*   Lipid Profile: No results for input(s): CHOL, HDL, LDLCALC, TRIG, CHOLHDL, LDLDIRECT in the last 72 hours. Thyroid Function Tests: No results for input(s): TSH, T4TOTAL, FREET4, T3FREE, THYROIDAB in the last 72 hours. Anemia Panel: No results for input(s): VITAMINB12, FOLATE, FERRITIN, TIBC, IRON, RETICCTPCT in the last 72 hours. Urine analysis: No results found for: COLORURINE, APPEARANCEUR, LABSPEC, PHURINE, GLUCOSEU, HGBUR, BILIRUBINUR, KETONESUR, PROTEINUR, UROBILINOGEN, NITRITE, LEUKOCYTESUR Sepsis Labs: @LABRCNTIP (procalcitonin:4,lacticidven:4) ) Recent Results (from the past 240 hour(s))  Respiratory Panel by RT  PCR (Flu A&B, Covid) - Nasopharyngeal Swab     Status: None   Collection Time: 05/12/19 11:43 PM   Specimen: Nasopharyngeal Swab  Result Value Ref Range Status   SARS Coronavirus 2 by RT PCR NEGATIVE NEGATIVE Final    Comment: (NOTE) SARS-CoV-2 target nucleic acids are NOT DETECTED. The SARS-CoV-2 RNA is generally detectable in upper respiratoy specimens during the acute phase of infection. The lowest concentration of SARS-CoV-2 viral copies this assay can detect is 131 copies/mL. A negative result does not preclude SARS-Cov-2 infection and should not be used as the sole basis for treatment or other patient management decisions. A negative result may occur with  improper specimen collection/handling, submission of specimen other than nasopharyngeal swab, presence of viral mutation(s) within the areas targeted by this assay, and inadequate number of viral copies (<131 copies/mL). A negative result must be combined with clinical observations, patient history, and epidemiological information. The expected result  is Negative. Fact Sheet for Patients:  https://www.moore.com/ Fact Sheet for Healthcare Providers:  https://www.young.biz/ This test is not yet ap proved or cleared by the [place] FDA and  has been authorized for detection and/or diagnosis of SARS-CoV-2 by FDA under an Emergency Use Authorization (EUA). This EUA will remain  in effect (meaning this test can be used) for the duration of the COVID-19 declaration under Section 564(b)(1) of the Act, 21 U.S.C. section 360bbb-3(b)(1), unless the authorization is terminated or revoked sooner.    Influenza A by PCR NEGATIVE NEGATIVE Final   Influenza B by PCR NEGATIVE NEGATIVE Final    Comment: (NOTE) The Xpert Xpress SARS-CoV-2/FLU/RSV assay is intended as an aid in  the diagnosis of influenza from Nasopharyngeal swab specimens and  should not be used as a sole basis for treatment. Nasal  washings and  aspirates are unacceptable for Xpert Xpress SARS-CoV-2/FLU/RSV  testing. Fact Sheet for Patients: https://www.moore.com/ Fact Sheet for Healthcare Providers: https://www.young.biz/ This test is not yet approved or cleared by the [place] FDA and  has been authorized for detection and/or diagnosis of SARS-CoV-2 by  FDA under an Emergency Use Authorization (EUA). This EUA will remain  in effect (meaning this test can be used) for the duration of the  Covid-19 declaration under Section 564(b)(1) of the Act, 21  U.S.C. section 360bbb-3(b)(1), unless the authorization is  terminated or revoked. Performed at [facility], [address] [place], [place] [address]   Respiratory Panel by RT PCR (Flu A&B, Covid) - Nasopharyngeal Swab     Status: None   Collection Time: 05/14/19  3:05 PM   Specimen: Nasopharyngeal Swab  Result Value Ref Range Status   SARS Coronavirus 2 by RT PCR NEGATIVE NEGATIVE Final    Comment: (NOTE) SARS-CoV-2 target nucleic acids are NOT DETECTED. The SARS-CoV-2 RNA is generally detectable in upper respiratoy specimens during the acute phase of infection. The lowest concentration of SARS-CoV-2 viral copies this assay can detect is 131 copies/mL. A negative result does not preclude SARS-Cov-2 infection and should not be used as the sole basis for treatment or other patient management decisions. A negative result may occur with  improper specimen collection/handling, submission of specimen other than nasopharyngeal swab, presence of viral mutation(s) within the areas targeted by this assay, and inadequate number of viral copies (<131 copies/mL). A negative result must be combined with clinical observations, patient history, and epidemiological information. The expected result is Negative. Fact Sheet for Patients:  https://www.moore.com/ Fact Sheet for Healthcare Providers:    https://www.young.biz/ This test is not yet ap proved or cleared by the [place] FDA and  has been authorized for detection and/or diagnosis of SARS-CoV-2 by FDA under an Emergency Use Authorization (EUA). This EUA will remain  in effect (meaning this test can be used) for the duration of the COVID-19 declaration under Section 564(b)(1) of the Act, 21 U.S.C. section 360bbb-3(b)(1), unless the authorization is terminated or revoked sooner.    Influenza A by PCR NEGATIVE NEGATIVE Final   Influenza B by PCR NEGATIVE NEGATIVE Final    Comment: (NOTE) The Xpert Xpress SARS-CoV-2/FLU/RSV assay is intended as an aid in  the diagnosis of influenza from Nasopharyngeal swab specimens and  should not be used as a sole basis for treatment. Nasal washings and  aspirates are unacceptable for Xpert Xpress SARS-CoV-2/FLU/RSV  testing. Fact Sheet for Patients: https://www.moore.com/ Fact Sheet for Healthcare Providers: https://www.young.biz/ This test is not yet approved or cleared by the [place]  FDA and  has been authorized for detection and/or diagnosis of SARS-CoV-2 by  FDA under an Emergency Use Authorization (EUA). This EUA will remain  in effect (meaning this test can be used) for the duration of the  Covid-19 declaration under Section 564(b)(1) of the Act, 21  U.S.C. section 360bbb-3(b)(1), unless the authorization is  terminated or revoked. Performed at [facility], [address] [place], [place] [address]      Radiological Exams on Admission: DG Chest 2 View  Result Date: 05/12/2019 CLINICAL DATA:  Left-sided chest pain for 10 minutes, history of pericarditis EXAM: CHEST - 2 VIEW COMPARISON:  12/19/2018 FINDINGS: Frontal and lateral views of the chest demonstrate an unremarkable cardiac silhouette. No airspace disease, effusion, or pneumothorax. No acute bony abnormalities. IMPRESSION: 1. No acute  intrathoracic process. Electronically Signed   By: Sharlet Salina M.D.   On: 05/12/2019 21:52     EKG: Not done in ED, will get one.   Assessment/Plan Principal Problem:   Acute metabolic encephalopathy Active Problems:   Alcohol withdrawal (HCC)   Hypokalemia   Abnormal LFTs   Pericarditis   Acute metabolic encephalopathy: Patient has a confusion, hallucination, possibly due to alcohol withdrawal versus DTs.  Cannot rule out psychosis. -Placed on MedSurg bed for observation -Start CIWA protocol - Frequent neuro check -Consult psychiatry  Alcohol withdrawal (HCC): -CiWA protocol -IVF: 1L NS and 125 cc/h of NS -Librium 10 mg tid  Hypokalemia: K= 3.3  on admission. - Repleted - check Mg and phosphorus level  Abnormal LFTs: ALP 81, AST 94, ALT 94, total bilirubin 0.9.  Alcohol abuse has at least partially contributed. -Avoid Tylenol -Check hepatitis panel and HIV antibody  Pericarditis: Patient denies chest pain, but has tachycardia which may be due to alcohol withdrawal.  In previous admission, 2D echo was ordered, but patient left hospital on AMA -will get 2D echo -As needed ibuprofen    DVT ppx: SQ Lovenox Code Status: Full code Family Communication: not done, no family member is at bed side.     Disposition Plan: to be determined, likely to behavioral unit Consults called: Psychiatry Admission status: Med-surg bed for obs    Status is: Observation  The patient remains OBS appropriate and will d/c before 2 midnights.  Dispo: The patient is from: Home              Anticipated d/c is to: Likely behavioral unit              Anticipated d/c date is: 1 day              Patient currently is not medically stable to d/c.           Date of Service 05/14/2019    Lorretta Harp [facility]   If 7PM-7AM, please contact night-coverage [email or web address] 05/14/2019, 5:55 PM

## 2019-05-14 NOTE — ED Notes (Signed)
Patient given food tray

## 2019-05-14 NOTE — Progress Notes (Signed)
At approximately 2110 the patient became more restless and wanting to leave the floor to go smoke. Explained to the patient that he could not leave the floor to smoke and that he had a nicotine patch on to assist with his cravings. He began to discontinue the telemetry, nicotine patch and attempted to discontinue the iv.  Steward Drone Morrision informed via telephone that patient's CIWA score had increased and according to the protocol, she was to be notified. New order received to administer an additional dose of Ativan 2mg  IV, but prior to my arrival, the patient began another attempt to leave the floor and would not comply with any safety recommendations. A security alert was initiated. Security, the [facility], and [facility] arrived to the room and assisted with de escalation. See MAR. Currently the patient is resting quietly in bed with a  safety sitter at the bedside.  Will continue to monitor.

## 2019-05-14 NOTE — ED Provider Notes (Addendum)
[facility] Emergency Department Provider Note  ____________________________________________   First MD Initiated Contact with Patient 05/14/19 1404     (approximate)  I have reviewed the triage vital signs and the nursing notes.   HISTORY  Chief Complaint Hallucinations    HPI Rodney Smith is a 29 y.o. male with prior pericarditis, alcohol use who comes in with concerns for psychosis..  Patient was found in the wounds in his apartment complex possibly naked.  Per the police patient was talking to stay in [place].  Patient reported having a "stick "but could help with transport.  Patient was admitted to the hospital 2 days ago for alcoholic withdrawal but left AMA yesterday.  Patient stated that he did drink alcohol today prior to coming to the hospital.  Patient denies any other psychiatric concerns in the past.  Patient denies any SI but does report having hallucinations and seeing snakes in the room and that he is talking to the snakes.    Unable to get full HPI due to patient's psychosis.            Past Medical History:  Diagnosis Date  . COVID-19   . Pericarditis     Patient Active Problem List   Diagnosis Date Noted  . Alcohol withdrawal (HCC) 05/13/2019  . Atypical chest pain 05/13/2019    History reviewed. No pertinent surgical history.  Prior to Admission medications   Not on File    Allergies Patient has no known allergies.  Family History  Problem Relation Age of Onset  . Heart attack Father   . CAD Paternal Grandfather     Social History Social History   Tobacco Use  . Smoking status: Current Some Day Smoker    Types: Cigarettes  . Smokeless tobacco: Never Used  . Tobacco comment: Smokes occasionally  Substance Use Topics  . Alcohol use: Yes    Alcohol/week: 84.0 standard drinks    Types: 84 Cans of beer per week  . Drug use: Never      Review of Systems Unable to get full review of systems due to  patient's psychosis ____________________________________________   PHYSICAL EXAM:  VITAL SIGNS: ED Triage Vitals  Enc Vitals Group     BP 05/14/19 1342 137/89     Pulse Rate 05/14/19 1342 (!) 132     Resp 05/14/19 1342 18     Temp 05/14/19 1342 98.5 F (36.9 C)     Temp Source 05/14/19 1342 Oral     SpO2 05/14/19 1342 94 %     Weight 05/14/19 1344 178 lb 9.2 oz (81 kg)     Height 05/14/19 1344 5\' 11"  (1.803 m)     Head Circumference --      Peak Flow --      Pain Score 05/14/19 1343 0     Pain Loc --      Pain Edu? --      Excl. in GC? --     Constitutional: Alert but talking to snakes in the room Eyes: Conjunctivae are normal. EOMI. Head: Atraumatic. Nose: No congestion/rhinnorhea. Mouth/Throat: Mucous membranes are moist.   Neck: No stridor. Trachea Midline. FROM Cardiovascular: Tachycardic, regular rhythm. Grossly normal heart sounds.  Good peripheral circulation. Respiratory: Normal respiratory effort.  No retractions. Lungs CTAB. Gastrointestinal: Soft and nontender. No distention. No abdominal bruits.  Musculoskeletal: No lower extremity tenderness nor edema.  No joint effusions. Neurologic: Walking around the room without any issues, moving all extremities well, no  obvious cranial nerve deficits Skin:  Skin is warm, dry and intact. No rash noted. Psychiatric: Positive new hallucinations, denies SI GU: Deferred   ____________________________________________   LABS (all labs ordered are listed, but only abnormal results are displayed)  Labs Reviewed  CBC  COMPREHENSIVE METABOLIC PANEL  ETHANOL  SALICYLATE LEVEL  ACETAMINOPHEN LEVEL  URINE DRUG SCREEN, QUALITATIVE ([facility] ONLY)   ____________________________________________   ED ECG REPORT I, Concha Se, the attending physician, personally viewed and interpreted this ECG.  Normal sinus, minimal st elevation, early repol, no twi, normal  intervals ____________________________________________ PROCEDURES  Procedure(s) performed (including Critical Care):  Procedures   ____________________________________________   INITIAL IMPRESSION / ASSESSMENT AND PLAN / ED COURSE  Rodney Smith was evaluated in Emergency Department on 05/14/2019 for the symptoms described in the history of present illness. He was evaluated in the context of the global COVID-19 pandemic, which necessitated consideration that the patient might be at risk for infection with the SARS-CoV-2 virus that causes COVID-19. Institutional protocols and algorithms that pertain to the evaluation of patients at risk for COVID-19 are in a state of rapid change based on information released by regulatory bodies including the CDC and federal and state organizations. These policies and algorithms were followed during the patient's care in the ED.    Patient is a 29 year old who comes in her IVC from police for patient being found walking around in the woods and psychotic talking to snakes and stated that steaks are in the room.  Patient is ambulate around the room with no evidence of trauma to suggest intracranial hemorrhage.  Will get labs to evaluate for Electra abnormalities, AKI.  Suspect that this could be related to DTs given patient was just discharged yesterday for alcohol withdrawal and patient is significantly tachycardic.  However he does not look significantly tremulous on exam which is kind of confusing.  His UDS does not show any signs of cocaine.  However given his recent leaving AMA from the medical service with alcohol withdrawal I think the safest thing would be to admit him to the hospital for IV Ativan and monitoring in case this could be DTs rather than a new primary psychosis disorder  ____________________________________________   FINAL CLINICAL IMPRESSION(S) / ED DIAGNOSES   Final diagnoses:  Delirium tremens (HCC)  Tachycardia      MEDICATIONS  GIVEN DURING THIS VISIT:  Medications  sucralfate (CARAFATE) tablet 1 g (1 g Oral Given 05/14/19 1519)  LORazepam (ATIVAN) injection 0-4 mg (2 mg Intravenous Given 05/14/19 1521)    Or  LORazepam (ATIVAN) tablet 0-4 mg ( Oral See Alternative 05/14/19 1521)  LORazepam (ATIVAN) injection 0-4 mg (has no administration in time range)    Or  LORazepam (ATIVAN) tablet 0-4 mg (has no administration in time range)  thiamine tablet 100 mg ( Oral See Alternative 05/14/19 1521)    Or  thiamine (B-1) injection 100 mg (100 mg Intravenous Given 05/14/19 1521)  ondansetron (ZOFRAN) injection 4 mg (has no administration in time range)  nicotine (NICODERM CQ - dosed in mg/24 hours) patch 21 mg (21 mg Transdermal Patch Applied 05/14/19 1520)  sodium chloride 0.9 % bolus 1,000 mL (1,000 mLs Intravenous New Bag/Given 05/14/19 1518)  potassium chloride SA (KLOR-CON) CR tablet 40 mEq (40 mEq Oral Given 05/14/19 1519)     ED Discharge Orders    None       Note:  This document was prepared using Dragon voice recognition software and may include unintentional dictation errors.  Vanessa [place], MD 05/14/19 1548    Vanessa [place], MD 06/20/19 [phone]

## 2019-05-14 NOTE — ED Notes (Signed)
Patient has removed monitoring devices for a second time.

## 2019-05-14 NOTE — Consult Note (Signed)
[facility] Face-to-Face Psychiatry Consult   Reason for Consult: Hx of alcohol abuse, tobacco abuse, pericarditis, who presents with altered mental  staus, hallucination  Referring Physician:  EPD  Patient Identification: Rodney Smith MRN:  [id number] Principal Diagnosis: Acute metabolic encephalopathy Diagnosis:  Principal Problem:   Acute metabolic encephalopathy Active Problems:   Alcohol withdrawal (HCC)   Hypokalemia   Abnormal LFTs   Total Time spent with patient: 15 minutes  Subjective:   Rodney Smith is a 29 y.o. male presented to the local emergency department by paramedics and police department.  As he reports " I was talking to the animals, I was sitting in the woods" patient is slightly disorganized during this assessment.  Denying suicidal or homicidal ideations.  Reports he is followed by psychiatrist where he is prescribed Xanax and a pill for my back.  Patient under involuntary commitment due to bizarre behavior hallucinations and altered mental status.  Chart reviewed suspected DTs from alcohol withdrawal.  Patient to be reconsulted after medical clearance.  Disposition to be made at that time.  Staff to continue to monitor for safety. UDS - pending. Aleck to be moved for medical floor. Support,encouragement and reassurance was provided  HPI: per admission assessment note: Pt to ED via [employer], pt was in the woods at an apartment complex, pt believed to be naked. Per BD pt was talking to [place] and [place] and bringing people back. Pt told BD that he had a "stick" that could help with transport. Pt said the stick helped him get here from [place]. Pt is cooperative at this time.  Past Psychiatric History:   Risk to Self:   Risk to Others:   Prior Inpatient Therapy:   Prior Outpatient Therapy:    Past Medical History:  Past Medical History:  Diagnosis Date  . COVID-19   . Pericarditis    History reviewed. No pertinent surgical history. Family History:  Family History   Problem Relation Age of Onset  . Heart attack Father   . CAD Paternal Grandfather    Family Psychiatric  History:  Social History:  Social History   Substance and Sexual Activity  Alcohol Use Yes  . Alcohol/week: 84.0 standard drinks  . Types: 84 Cans of beer per week     Social History   Substance and Sexual Activity  Drug Use Never    Social History   Socioeconomic History  . Marital status: Single    Spouse name: Not on file  . Number of children: Not on file  . Years of education: Not on file  . Highest education level: Not on file  Occupational History  . Not on file  Tobacco Use  . Smoking status: Current Some Day Smoker    Types: Cigarettes  . Smokeless tobacco: Never Used  . Tobacco comment: Smokes occasionally  Substance and Sexual Activity  . Alcohol use: Yes    Alcohol/week: 84.0 standard drinks    Types: 84 Cans of beer per week  . Drug use: Never  . Sexual activity: Not on file  Other Topics Concern  . Not on file  Social History Narrative  . Not on file   Social Determinants of Health   Financial Resource Strain:   . Difficulty of Paying Living Expenses:   Food Insecurity:   . Worried About Programme researcher, broadcasting/film/video in the Last Year:   . Barista in the Last Year:   Transportation Needs:   . Freight forwarder (Medical):   Marland Kitchen  Lack of Transportation (Non-Medical):   Physical Activity:   . Days of Exercise per Week:   . Minutes of Exercise per Session:   Stress:   . Feeling of Stress :   Social Connections:   . Frequency of Communication with Friends and Family:   . Frequency of Social Gatherings with Friends and Family:   . Attends Religious Services:   . Active Member of Clubs or Organizations:   . Attends Archivist Meetings:   Marland Kitchen Marital Status:    Additional Social History:    Allergies:  No Known Allergies  Labs:  Results for orders placed or performed during the hospital encounter of 05/14/19 (from the past 48  hour(s))  Comprehensive metabolic panel     Status: Abnormal   Collection Time: 05/14/19  1:48 PM  Result Value Ref Range   Sodium 140 135 - 145 mmol/L   Potassium 3.3 (L) 3.5 - 5.1 mmol/L   Chloride 106 98 - 111 mmol/L   CO2 19 (L) 22 - 32 mmol/L   Glucose, Bld 94 70 - 99 mg/dL    Comment: Glucose reference range applies only to samples taken after fasting for at least 8 hours.   BUN <5 (L) 6 - 20 mg/dL   Creatinine, Ser 0.63 0.61 - 1.24 mg/dL   Calcium 9.4 8.9 - 10.3 mg/dL   Total Protein 7.7 6.5 - 8.1 g/dL   Albumin 4.8 3.5 - 5.0 g/dL   AST 94 (H) 15 - 41 U/L   ALT 99 (H) 0 - 44 U/L   Alkaline Phosphatase 81 38 - 126 U/L   Total Bilirubin 0.9 0.3 - 1.2 mg/dL   GFR calc non Af Amer >60 >60 mL/min   GFR calc Af Amer >60 >60 mL/min   Anion gap 15 5 - 15    Comment: Performed at [facility], [address] [place], [place] [address]  Ethanol     Status: Abnormal   Collection Time: 05/14/19  1:48 PM  Result Value Ref Range   Alcohol, Ethyl (B) 102 (H) <10 mg/dL    Comment: (NOTE) Lowest detectable limit for serum alcohol is 10 mg/dL. For medical purposes only. Performed at [facility], [address] [place], [place] [address]   Salicylate level     Status: Abnormal   Collection Time: 05/14/19  1:48 PM  Result Value Ref Range   Salicylate Lvl <3.5 (L) 7.0 - 30.0 mg/dL    Comment: Performed at [facility], [address] [place], [place] [address]  Acetaminophen level     Status: Abnormal   Collection Time: 05/14/19  1:48 PM  Result Value Ref Range   Acetaminophen (Tylenol), Serum <10 (L) 10 - 30 ug/mL    Comment: (NOTE) Therapeutic concentrations vary significantly. A range of 10-30 ug/mL  may be an effective concentration for many patients. However, some  are best treated at concentrations outside of this range. Acetaminophen concentrations >150 ug/mL at 4 hours after ingestion  and >50 ug/mL at 12 hours after ingestion are often  associated with  toxic reactions. Performed at [facility], [address] [place], [place] [address]   cbc     Status: None   Collection Time: 05/14/19  1:48 PM  Result Value Ref Range   WBC 9.4 4.0 - 10.5 K/uL   RBC 4.52 4.22 - 5.81 MIL/uL   Hemoglobin 14.7 13.0 - 17.0 g/dL   HCT 42.3 39.0 - 52.0 %  MCV 93.6 80.0 - 100.0 fL   MCH 32.5 26.0 - 34.0 pg   MCHC 34.8 30.0 - 36.0 g/dL   RDW 11.5 72.6 - 20.3 %   Platelets 179 150 - 400 K/uL   nRBC 0.0 0.0 - 0.2 %    Comment: Performed at [facility], [address] [place], [place] [address]  Urine Drug Screen, Qualitative     Status: Abnormal   Collection Time: 05/14/19  1:48 PM  Result Value Ref Range   Tricyclic, Ur Screen NONE DETECTED NONE DETECTED   Amphetamines, Ur Screen NONE DETECTED NONE DETECTED   MDMA (Ecstasy)Ur Screen NONE DETECTED NONE DETECTED   Cocaine Metabolite,Ur [place] NONE DETECTED NONE DETECTED   Opiate, Ur Screen NONE DETECTED NONE DETECTED   Phencyclidine (PCP) Ur S NONE DETECTED NONE DETECTED   Cannabinoid 50 Ng, Ur [place] NONE DETECTED NONE DETECTED   Barbiturates, Ur Screen NONE DETECTED NONE DETECTED   Benzodiazepine, Ur Scrn POSITIVE (A) NONE DETECTED   Methadone Scn, Ur NONE DETECTED NONE DETECTED    Comment: (NOTE) Tricyclics + metabolites, urine    Cutoff 1000 ng/mL Amphetamines + metabolites, urine  Cutoff 1000 ng/mL MDMA (Ecstasy), urine              Cutoff 500 ng/mL Cocaine Metabolite, urine          Cutoff 300 ng/mL Opiate + metabolites, urine        Cutoff 300 ng/mL Phencyclidine (PCP), urine         Cutoff 25 ng/mL Cannabinoid, urine                 Cutoff 50 ng/mL Barbiturates + metabolites, urine  Cutoff 200 ng/mL Benzodiazepine, urine              Cutoff 200 ng/mL Methadone, urine                   Cutoff 300 ng/mL The urine drug screen provides only a preliminary, unconfirmed analytical test result and should not be used for non-medical purposes. Clinical consideration  and professional judgment should be applied to any positive drug screen result due to possible interfering substances. A more specific alternate chemical method must be used in order to obtain a confirmed analytical result. Gas chromatography / mass spectrometry (GC/MS) is the preferred confirmat ory method. Performed at [facility], [address] [place], [place] [address]     Current Facility-Administered Medications  Medication Dose Route Frequency Provider Last Rate Last Admin  . LORazepam (ATIVAN) injection 0-4 mg  0-4 mg Intravenous Q6H Concha Se, MD       Or  . LORazepam (ATIVAN) tablet 0-4 mg  0-4 mg Oral Q6H Concha Se, MD      . Melene Muller ON 05/16/2019] LORazepam (ATIVAN) injection 0-4 mg  0-4 mg Intravenous Q12H Concha Se, MD       Or  . Melene Muller ON 05/16/2019] LORazepam (ATIVAN) tablet 0-4 mg  0-4 mg Oral Q12H Concha Se, MD      . nicotine (NICODERM CQ - dosed in mg/24 hours) patch 21 mg  21 mg Transdermal Daily Lorretta Harp, MD      . ondansetron [facility]) injection 4 mg  4 mg Intravenous Q8H PRN Lorretta Harp, MD      . potassium chloride SA (KLOR-CON) CR tablet 40 mEq  40 mEq Oral Once Lorretta Harp, MD      . sodium chloride 0.9 % bolus 1,000 mL  1,000 mL Intravenous Once Concha Se, MD      . sucralfate (CARAFATE) tablet 1 g  1 g Oral TID WC & HS Concha Se, MD      . thiamine tablet 100 mg  100 mg Oral Daily Concha Se, MD       Or  . thiamine (B-1) injection 100 mg  100 mg Intravenous Daily Concha Se, MD       No current outpatient medications on file.    Musculoskeletal: Strength & Muscle Tone: within normal limits Gait & Station: Observed sitting in bed Patient leans: N/A  Psychiatric Specialty Exam: Physical Exam  Vitals reviewed. Psychiatric: He has a normal mood and affect. His behavior is normal.    Review of Systems  Blood pressure 122/69, pulse (!) 115, temperature 98.5 F (36.9 C), temperature source Oral, resp. rate 19,  height 5\' 11"  (1.803 m), weight 81 kg, SpO2 100 %.Body mass index is 24.91 kg/m.  General Appearance: Disheveled and Guarded  Eye Contact:  Fair  Speech:  Clear and Coherent  Volume:  Normal  Mood:  Dysphoric  Affect:  Congruent  Thought Process:  Disorganized and Linear  Orientation:  Full (Time, Place, and Person)  Thought Content:  Hallucinations: Auditory Visual, Paranoid Ideation and Rumination  Suicidal Thoughts:  No  Homicidal Thoughts:  No  Memory:  Immediate;   Fair Recent;   Fair  Judgement:  Impaired  Insight:  Lacking  Psychomotor Activity:  NA  Concentration:  Concentration: Fair  Recall:  of Knowledge:  Fair  Language:  Fair  Akathisia:  No  Handed:  Right  AIMS (if indicated):     Assets:  Communication Skills Desire for Improvement Resilience Social Support  ADL's:  Intact  Cognition:  WNL  Sleep:        Treatment Plan Summary: Daily contact with patient to assess and evaluate symptoms and progress in treatment and Medication management   - Patient was placed on Ativan Detox Protocol  - CSW to follow-up with additional collateral   Disposition: Please reconsult after medical clearance  [employer], NP 05/14/2019 3:06 PM

## 2019-05-14 NOTE — ED Notes (Signed)
Report given to RN for 1C- Room 102, however patient can not be accepted due to patient not having Sitter due to patient being [employer].  Charge RN has been made aware.

## 2019-05-15 ENCOUNTER — Observation Stay (HOSPITAL_COMMUNITY)
Admit: 2019-05-15 | Discharge: 2019-05-15 | Disposition: A | Discharge status: Home / Self Care | Payer: Medicaid Other | Attending: Internal Medicine | Admitting: Internal Medicine

## 2019-05-15 DIAGNOSIS — I319 Disease of pericardium, unspecified: Secondary | ICD-10-CM | POA: Diagnosis not present

## 2019-05-15 DIAGNOSIS — I309 Acute pericarditis, unspecified: Secondary | ICD-10-CM

## 2019-05-15 DIAGNOSIS — G9340 Encephalopathy, unspecified: Secondary | ICD-10-CM | POA: Diagnosis present

## 2019-05-15 DIAGNOSIS — G9341 Metabolic encephalopathy: Secondary | ICD-10-CM | POA: Diagnosis not present

## 2019-05-15 DIAGNOSIS — F10230 Alcohol dependence with withdrawal, uncomplicated: Secondary | ICD-10-CM | POA: Diagnosis not present

## 2019-05-15 LAB — BASIC METABOLIC PANEL
Anion gap: 7 (ref 5–15)
BUN: 6 mg/dL (ref 6–20)
CO2: 25 mmol/L (ref 22–32)
Calcium: 8.6 mg/dL — ABNORMAL LOW (ref 8.9–10.3)
Chloride: 109 mmol/L (ref 98–111)
Creatinine, Ser: 0.65 mg/dL (ref 0.61–1.24)
GFR calc Af Amer: >60 mL/min (ref >60)
GFR calc non Af Amer: >60 mL/min (ref >60)
Glucose, Bld: 86 mg/dL (ref 70–99)
Potassium: 3.7 mmol/L (ref 3.5–5.1)
Sodium: 141 mmol/L (ref 135–145)

## 2019-05-15 LAB — ECHOCARDIOGRAM COMPLETE
Height: 71 in
Weight: 2857.16 oz

## 2019-05-15 LAB — CBC
HCT: 37.4 % — ABNORMAL LOW (ref 39.0–52.0)
Hemoglobin: 13 g/dL (ref 13.0–17.0)
MCH: 33 pg (ref 26.0–34.0)
MCHC: 34.8 g/dL (ref 30.0–36.0)
MCV: 94.9 fL (ref 80.0–100.0)
Platelets: 134 10*3/uL — ABNORMAL LOW (ref 150–400)
RBC: 3.94 MIL/uL — ABNORMAL LOW (ref 4.22–5.81)
RDW: 15.2 % (ref 11.5–15.5)
WBC: 6.2 10*3/uL (ref 4.0–10.5)
nRBC: 0 % (ref 0.0–0.2)

## 2019-05-15 MED ORDER — MELATONIN 5 MG PO TABS
5.0000 mg | ORAL_TABLET | Freq: Every day | ORAL | Status: DC
  Administered 2019-05-15: 5 mg via ORAL
  Filled 2019-05-15 (×2): qty 1

## 2019-05-15 NOTE — Progress Notes (Signed)
*  PRELIMINARY RESULTS* Echocardiogram 2D Echocardiogram has been performed.  Cristela Blue 05/15/2019, 10:05 AM

## 2019-05-15 NOTE — Consult Note (Signed)
I read the note by [name] yesterday at 2 PM and Attestation by Sharma Covert, MD at 05/15/2019 at 12:10 PM. He noted "Case discussed and plan agreed upon as outlined above by nurse practitioner [name].  Patient is a 29 year old male who was brought to the local emergency department by paramedics and police.  Patient is suspected to be withdrawing from alcohol as well as possible benzodiazepines.  His behavior is suggestive of psychosis or delirium.  Agree with Ativan detox protocol at this point"  I discussed the patient with nursing staff and met with the patient in his room alongside his father and the one-to-one worker.  The patient was initially quite somnolent but was able to be woken up.  He spoke briefly with his mother on the phone.  Overall he provides very little useful historical data.  He does not recall much of the events other to say he was more upset yesterday.  His recall appears to be repeating what he is heard rather than actually remembering the experience.  He cannot provide any details regarding the earlier stay in the emergency room and then this evaluation.  He denies any lethality or psychiatric symptoms although the above status as a poor historian render these not terribly useful.  I reviewed his extensive medical work-up.  I do not see much evidence that this is alcohol withdrawal based on a level of 41 3 days ago at a level of 102 yesterday.  Of note is that his urine drug screen 3 days ago was also negative for all substances.  The cause of his agitation and psychosis appear to be of a more organic nature and unrelated to substances.  Nor do I see an obvious psychiatric diagnosis which would explain the behavior we have seen.  I see that the medical work-up is continuing and I highly recommend that that continue.  If there is no obvious metabolic etiology for the symptoms we are seeing I recommend consulting neurology and consideration of a lumbar puncture as well as a head  scan.    If agitation returns and if the QT interval is acceptable I would treat with ziprasidone at a dose of 60 mg twice a day.  If there is no evidence of alcohol withdrawal then the chlordiazepoxide can be discontinued as can the use of lorazepam.

## 2019-05-15 NOTE — Progress Notes (Signed)
PROGRESS NOTE    Kaiel Weide  ZOX:[id number] DOB: 09/01/1990 DOA: 05/14/2019 PCP: Patient, No Pcp Per       Assessment & Plan:   Principal Problem:   Acute metabolic encephalopathy Active Problems:   Alcohol withdrawal (HCC)   Hypokalemia   Abnormal LFTs   Pericarditis   Acute metabolic encephalopathy: possibly due to alcohol withdrawal versus DTs. Cannot rule out psychosis. Continue on CIWA protocol. Denies audiatory or visual hallucinations today & does not know how he got to the hospital or the events leading up to him coming to the hospital. Frequent neuro check. Psychiatry consulted and awaiting recs.   Alcohol withdrawal: continue CIWA protocol. Continue on thiamine. Continue on IVFs.   Hypokalemia: WNL today. Will continue to monitor   Transaminitis: alcohol abuse has at least partially contributed. Avoid tylenol.  Hepatitis panel pending. HIV antibody non-reactive   Pericarditis:  denies chest pain, but has tachycardia which may be due to alcohol withdrawal. NSAIDs prn. Echo pending   Thrombocytopenia: likely secondary to alcohol abuse. Will continue to monitor    DVT prophylaxis:  lovenox Code Status:  Full  Family Communication:  Disposition Plan: depends on psych recs   Consultants:   psych   Procedures:    Antimicrobials:    Subjective: Pt c/o malaise   Objective: Vitals:   05/14/19 2000 05/14/19 2304 05/15/19 0200 05/15/19 0359  BP:  (!) 117/96  136/86  Pulse: (!) 107 91  91  Resp: 18 16 15 15   Temp: 98.7 F (37.1 C) 97.8 F (36.6 C)  97.8 F (36.6 C)  TempSrc: Oral Oral  Oral  SpO2: 96% 97%  99%  Weight:      Height:        Intake/Output Summary (Last 24 hours) at 05/15/2019 07/15/2019 Last data filed at 05/15/2019 0400 Gross per 24 hour  Intake 2244.34 ml  Output --  Net 2244.34 ml   Filed Weights   05/14/19 1344  Weight: 81 kg    Examination:  General exam: Appears calm and comfortable. Disheveled. Appears older than  stated age  Respiratory system: Clear to auscultation. No wheezes, rales. Cardiovascular system: S1 & S2 +. No rubs, gallops or clicks.  Gastrointestinal system: Abdomen is nondistended, soft and nontender. . Normal bowel sounds heard. Central nervous system: Alert and oriented. Moves all 4 extremities. Psychiatry: Flat mood and affect    Data Reviewed: I have personally reviewed following labs and imaging studies  CBC: Recent Labs  Lab 05/12/19 2140 05/13/19 0549 05/14/19 1348 05/15/19 0457  WBC 7.2 5.7 9.4 6.2  HGB 17.2* 14.2 14.7 13.0  HCT 48.8 39.2 42.3 37.4*  MCV 92.8 91.6 93.6 94.9  PLT 228 173 179 134*   Basic Metabolic Panel: Recent Labs  Lab 05/12/19 2140 05/13/19 0549 05/14/19 1348 05/15/19 0457  NA 134* 137 140 141  K 3.2* 3.9 3.3* 3.7  CL 96* 105 106 109  CO2 22 24 19* 25  GLUCOSE 94 93 94 86  BUN 10 13 <5* 6  CREATININE 0.82 0.75 0.63 0.65  CALCIUM 9.6 8.4* 9.4 8.6*  MG  --  1.7 1.5*  --   PHOS  --  3.9 3.0  --    GFR: Estimated Creatinine Clearance: 146.4 mL/min (by C-G formula based on SCr of 0.65 mg/dL). Liver Function Tests: Recent Labs  Lab 05/13/19 0549 05/14/19 1348  AST 82* 94*  ALT 87* 99*  ALKPHOS 80 81  BILITOT 1.2 0.9  PROT 6.7 7.7  ALBUMIN  4.0 4.8   No results for input(s): LIPASE, AMYLASE in the last 168 hours. No results for input(s): AMMONIA in the last 168 hours. Coagulation Profile: No results for input(s): INR, PROTIME in the last 168 hours. Cardiac Enzymes: No results for input(s): CKTOTAL, CKMB, CKMBINDEX, TROPONINI in the last 168 hours. BNP (last 3 results) No results for input(s): PROBNP in the last 8760 hours. HbA1C: No results for input(s): HGBA1C in the last 72 hours. CBG: Recent Labs  Lab 05/13/19 0748  GLUCAP 101*   Lipid Profile: No results for input(s): CHOL, HDL, LDLCALC, TRIG, CHOLHDL, LDLDIRECT in the last 72 hours. Thyroid Function Tests: No results for input(s): TSH, T4TOTAL, FREET4, T3FREE,  THYROIDAB in the last 72 hours. Anemia Panel: No results for input(s): VITAMINB12, FOLATE, FERRITIN, TIBC, IRON, RETICCTPCT in the last 72 hours. Sepsis Labs: No results for input(s): PROCALCITON, LATICACIDVEN in the last 168 hours.  Recent Results (from the past 240 hour(s))  Respiratory Panel by RT PCR (Flu A&B, Covid) - Nasopharyngeal Swab     Status: None   Collection Time: 05/12/19 11:43 PM   Specimen: Nasopharyngeal Swab  Result Value Ref Range Status   SARS Coronavirus 2 by RT PCR NEGATIVE NEGATIVE Final    Comment: (NOTE) SARS-CoV-2 target nucleic acids are NOT DETECTED. The SARS-CoV-2 RNA is generally detectable in upper respiratoy specimens during the acute phase of infection. The lowest concentration of SARS-CoV-2 viral copies this assay can detect is 131 copies/mL. A negative result does not preclude SARS-Cov-2 infection and should not be used as the sole basis for treatment or other patient management decisions. A negative result may occur with  improper specimen collection/handling, submission of specimen other than nasopharyngeal swab, presence of viral mutation(s) within the areas targeted by this assay, and inadequate number of viral copies (<131 copies/mL). A negative result must be combined with clinical observations, patient history, and epidemiological information. The expected result is Negative. Fact Sheet for Patients:  PinkCheek.be Fact Sheet for Healthcare Providers:  GravelBags.it This test is not yet ap proved or cleared by the [place] FDA and  has been authorized for detection and/or diagnosis of SARS-CoV-2 by FDA under an Emergency Use Authorization (EUA). This EUA will remain  in effect (meaning this test can be used) for the duration of the COVID-19 declaration under Section 564(b)(1) of the Act, 21 U.S.C. section 360bbb-3(b)(1), unless the authorization is terminated or revoked  sooner.    Influenza A by PCR NEGATIVE NEGATIVE Final   Influenza B by PCR NEGATIVE NEGATIVE Final    Comment: (NOTE) The Xpert Xpress SARS-CoV-2/FLU/RSV assay is intended as an aid in  the diagnosis of influenza from Nasopharyngeal swab specimens and  should not be used as a sole basis for treatment. Nasal washings and  aspirates are unacceptable for Xpert Xpress SARS-CoV-2/FLU/RSV  testing. Fact Sheet for Patients: PinkCheek.be Fact Sheet for Healthcare Providers: GravelBags.it This test is not yet approved or cleared by the [place] FDA and  has been authorized for detection and/or diagnosis of SARS-CoV-2 by  FDA under an Emergency Use Authorization (EUA). This EUA will remain  in effect (meaning this test can be used) for the duration of the  Covid-19 declaration under Section 564(b)(1) of the Act, 21  U.S.C. section 360bbb-3(b)(1), unless the authorization is  terminated or revoked. Performed at [facility], [address] [place], [place] [address]   Respiratory Panel by RT PCR (Flu A&B, Covid) - Nasopharyngeal Swab     Status: None  Collection Time: 05/14/19  3:05 PM   Specimen: Nasopharyngeal Swab  Result Value Ref Range Status   SARS Coronavirus 2 by RT PCR NEGATIVE NEGATIVE Final    Comment: (NOTE) SARS-CoV-2 target nucleic acids are NOT DETECTED. The SARS-CoV-2 RNA is generally detectable in upper respiratoy specimens during the acute phase of infection. The lowest concentration of SARS-CoV-2 viral copies this assay can detect is 131 copies/mL. A negative result does not preclude SARS-Cov-2 infection and should not be used as the sole basis for treatment or other patient management decisions. A negative result may occur with  improper specimen collection/handling, submission of specimen other than nasopharyngeal swab, presence of viral mutation(s) within the areas targeted by this assay, and  inadequate number of viral copies (<131 copies/mL). A negative result must be combined with clinical observations, patient history, and epidemiological information. The expected result is Negative. Fact Sheet for Patients:  https://www.moore.com/ Fact Sheet for Healthcare Providers:  https://www.young.biz/ This test is not yet ap proved or cleared by the [place] FDA and  has been authorized for detection and/or diagnosis of SARS-CoV-2 by FDA under an Emergency Use Authorization (EUA). This EUA will remain  in effect (meaning this test can be used) for the duration of the COVID-19 declaration under Section 564(b)(1) of the Act, 21 U.S.C. section 360bbb-3(b)(1), unless the authorization is terminated or revoked sooner.    Influenza A by PCR NEGATIVE NEGATIVE Final   Influenza B by PCR NEGATIVE NEGATIVE Final    Comment: (NOTE) The Xpert Xpress SARS-CoV-2/FLU/RSV assay is intended as an aid in  the diagnosis of influenza from Nasopharyngeal swab specimens and  should not be used as a sole basis for treatment. Nasal washings and  aspirates are unacceptable for Xpert Xpress SARS-CoV-2/FLU/RSV  testing. Fact Sheet for Patients: https://www.moore.com/ Fact Sheet for Healthcare Providers: https://www.young.biz/ This test is not yet approved or cleared by the [place] FDA and  has been authorized for detection and/or diagnosis of SARS-CoV-2 by  FDA under an Emergency Use Authorization (EUA). This EUA will remain  in effect (meaning this test can be used) for the duration of the  Covid-19 declaration under Section 564(b)(1) of the Act, 21  U.S.C. section 360bbb-3(b)(1), unless the authorization is  terminated or revoked. Performed at [facility], [address], [place], [place] [address]          Radiology Studies: No results found.      Scheduled Meds: . chlordiazePOXIDE  10  mg Oral TID  . enoxaparin (LOVENOX) injection  40 mg Subcutaneous Q24H  . LORazepam  0-4 mg Intravenous Q6H   Or  . LORazepam  0-4 mg Oral Q6H  . [START ON 05/16/2019] LORazepam  0-4 mg Intravenous Q12H   Or  . [START ON 05/16/2019] LORazepam  0-4 mg Oral Q12H  . nicotine  21 mg Transdermal Daily  . sucralfate  1 g Oral TID WC & HS  . thiamine  100 mg Oral Daily   Or  . thiamine  100 mg Intravenous Daily   Continuous Infusions: . sodium chloride 125 mL/hr at 05/15/19 0400     LOS: 0 days    Time spent: 34 mins     Charise Killian, MD [facility] Pager [phone]  If 7PM-7AM, please contact night-coverage [email or web address] 05/15/2019, 7:12 AM

## 2019-05-16 ENCOUNTER — Inpatient Hospital Stay: Payer: Self-pay

## 2019-05-16 DIAGNOSIS — G9341 Metabolic encephalopathy: Secondary | ICD-10-CM | POA: Diagnosis not present

## 2019-05-16 LAB — HEPATITIS PANEL, ACUTE
HCV Ab: >11 s/co ratio — ABNORMAL HIGH (ref 0.0–0.9)
Hep A IgM: NEGATIVE — AB
Hep B C IgM: NEGATIVE — AB
Hepatitis B Surface Ag: NEGATIVE — AB

## 2019-05-16 LAB — BASIC METABOLIC PANEL
Anion gap: 9 (ref 5–15)
BUN: 6 mg/dL (ref 6–20)
CO2: 25 mmol/L (ref 22–32)
Calcium: 8.9 mg/dL (ref 8.9–10.3)
Chloride: 106 mmol/L (ref 98–111)
Creatinine, Ser: 0.55 mg/dL — ABNORMAL LOW (ref 0.61–1.24)
GFR calc Af Amer: >60 mL/min (ref >60)
GFR calc non Af Amer: >60 mL/min (ref >60)
Glucose, Bld: 117 mg/dL — ABNORMAL HIGH (ref 70–99)
Potassium: 3.7 mmol/L (ref 3.5–5.1)
Sodium: 140 mmol/L (ref 135–145)

## 2019-05-16 LAB — CBC
HCT: 37.5 % — ABNORMAL LOW (ref 39.0–52.0)
Hemoglobin: 13.5 g/dL (ref 13.0–17.0)
MCH: 33.4 pg (ref 26.0–34.0)
MCHC: 36 g/dL (ref 30.0–36.0)
MCV: 92.8 fL (ref 80.0–100.0)
Platelets: 133 10*3/uL — ABNORMAL LOW (ref 150–400)
RBC: 4.04 MIL/uL — ABNORMAL LOW (ref 4.22–5.81)
RDW: 14.6 % (ref 11.5–15.5)
WBC: 4.9 10*3/uL (ref 4.0–10.5)
nRBC: 0 % (ref 0.0–0.2)

## 2019-05-16 LAB — AST: AST: 55 U/L — ABNORMAL HIGH (ref 15–41)

## 2019-05-16 LAB — BILIRUBIN, TOTAL: Total Bilirubin: 0.7 mg/dL (ref 0.3–1.2)

## 2019-05-16 LAB — ALT: ALT: 71 U/L — ABNORMAL HIGH (ref 0–44)

## 2019-05-16 MED ORDER — LORAZEPAM 2 MG PO TABS
0.0000 mg | ORAL_TABLET | Freq: Four times a day (QID) | ORAL | Status: DC | PRN
  Administered 2019-05-16: 13:00:00 2 mg via ORAL
  Filled 2019-05-16: qty 1

## 2019-05-16 MED ORDER — LORAZEPAM 0.5 MG PO TABS
0.5000 mg | ORAL_TABLET | Freq: Every day | ORAL | 0 refills | Status: AC | PRN
Start: 2019-05-16 — End: 2019-05-21

## 2019-05-16 MED ORDER — LORAZEPAM 2 MG/ML IJ SOLN: 0.0000 mg | Freq: Four times a day (QID) | INTRAMUSCULAR | Status: DC | PRN

## 2019-05-16 MED ORDER — NICOTINE 21 MG/24HR TD PT24: 21.0000 mg | MEDICATED_PATCH | Freq: Every day | TRANSDERMAL | 0 refills | Status: AC

## 2019-05-16 MED ORDER — TRAZODONE HCL 50 MG PO TABS
50.0000 mg | ORAL_TABLET | Freq: Every day | ORAL | Status: DC
  Administered 2019-05-16: 01:00:00 50 mg via ORAL
  Filled 2019-05-16: qty 1

## 2019-05-16 NOTE — Discharge Summary (Addendum)
Physician Discharge Summary  Rodney Smith ZOX:[id number] DOB: 04/16/1990 DOA: 05/14/2019  PCP: Patient, No Pcp Per  Admit date: 05/14/2019 Discharge date: 05/16/2019  Admitted From: home Disposition:  home  Recommendations for Outpatient Follow-up:  1. Follow up with PCP in 1 week 2. F/u psych ASAP  Home Health: no Equipment/Devices:   Discharge Condition: stable  CODE STATUS: full  Diet recommendation:  Regular  Brief/Interim Summary: HPI was taken from Dr. Clyde Lundborg: Rodney Smith is a 29 y.o. male with medical history significant of pericarditis after having Covid-19 infection last year, alcohol abuse, tobacco abuse, who presents with altered mental status.  Pt was admitted to hospital yesterday due to alcohol withdrawal and chest pain 2/2 possible pericarditis.  Patient left hospital AMA. Per report, pt was found in the woood in his apartment complex, possibly naked. He was talking to [place] and Jesus. Pt said the stick helped him get here from [place].  He has hallucination and is seeing snakes in the room.  He denies suicidal or homicidal ideations.  Patient denies chest pain, cough, shortness breath, fever, chills.  No nausea, vomiting, diarrhea, abdominal pain, symptoms of UTI.  He moves all extremities normally.  ED Course: pt was found to have WBC 9.4, UDS positive for benzo, Tylenol level less than 10, salicylate level less than 7, negative COVID-19 PCR, alcohol level 102, potassium 3.3, renal function okay, temperature normal, tachycardia with heart rate of 130s, oxygen saturation 94-100% on room air, blood pressure 137/89.  Patient had a negative chest x-ray on 5/7.  Patient is placed on MedSurg bed for observation.  Psychiatry is consulted.  Hospital Course from Dr. Wilfred Lacy 05/15/19- 05/16/19: Pt presented w/ acute encephalopathy of unknown etiology. Likely secondary to alcohol abuse. Ethanol was elevated on admission but drug screen was neg. Pt does not remember the events  leading up to coming to the hospital. Pt stated "I do stupid s**t when I drink." A sitter was placed with pt on admission as well as IVC placement. Psychiatry was consulted and stated unlikely psychiatry dx. CT brain was done which showed Small subcortical hemorrhages in the left frontal and right posterior cerebrum with coup/contrecoup pattern that correlates with history of head trauma. The largest is 1 cm and there is no associated mass effect. Discussed w/ neurosurgery (Dr. Adriana Simas) who indicated no surgical intervention needed and recommended a repeat CT brain in approx 8 hours. Repeat CT brain scan remained unchanged. Pt was told to return to the ER if any of the following symptoms/signs occurred including but not limited to a change of mental status, nausea, vomiting, blurry vision, difficulty walking or talking. Pt verbalized his understanding. Pt was mental status back to baseline when I saw the pt on the day of d/c.The IVC was re-sended. 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 Pt will f/u with his PCP for evaluation and treatment. Pt verbalized his understanding.    Discharge Diagnoses:  Principal Problem:   Acute metabolic encephalopathy Active Problems:   Alcohol withdrawal (HCC)   Hypokalemia   Abnormal LFTs   Pericarditis   Encephalopathy Acute metabolic encephalopathy: resolved. Etiology unclear, possibly due to alcohol withdrawal versus DTs. Cannot rule out psychosis. Drug screen was neg but does not r/o other substances that are not tested in the drug screen. Continue on CIWA protocol while inpatient. Denies audiatory or visual hallucinations today & does not know how he got to the hospital or the events  leading up to him coming to the hospital. Frequent neuro checks. Unlikely psychiatric dx as per psychiatry   Subcortical hemorrhages: w/o mass effect on CT brain. Repeat CT brain remains unchanged. Etiology unclear, but likely from a fall although  pt denies any recent falls or trauma. Pt did have an elevated ethanol level on admission (w/ hx of alcohol abuse) w/ altered mental status and no recollection of the events leading up to hospital admission. No surgical intervention indicated as per neurosurg.   Alcohol withdrawal: continue CIWA protocol while inpatient. Continue on thiamine. Continue on IVFs. Alcohol cessation counseling.   Hypokalemia: WNL today. Will continue to monitor   Transaminitis: alcohol abuse has at least partially contributed. Avoid tylenol. HIV antibody non-reactive   HCV: pt is aware of this dx. Will f/u w/ his PCP outpatient for further evaluation and treatment   Pericarditis:  denies chest pain, but has tachycardia which may be due to alcohol withdrawal. NSAIDs prn. Echo is normal  Thrombocytopenia: likely secondary to alcohol abuse. Will continue to monitor     Discharge Instructions  Discharge Instructions    Diet general   Complete by: As directed    Discharge instructions   Complete by: As directed    F/u PCP in 1 week; F/u w/ psychiatry as soon as possible   Increase activity slowly   Complete by: As directed      Allergies as of 05/16/2019   No Known Allergies     Medication List    TAKE these medications   LORazepam 0.5 MG tablet Commonly known as: Ativan Take 1 tablet (0.5 mg total) by mouth daily as needed for up to 5 days for anxiety.   nicotine 21 mg/24hr patch Commonly known as: NICODERM CQ - dosed in mg/24 hours Place 1 patch (21 mg total) onto the skin daily. Start taking on: May 17, 2019       No Known Allergies  Consultations:  Psych, Dr. Burgess Estelle  Neurosurg, Dr. Adriana Simas   Procedures/Studies: DG Chest 2 View  Result Date: 05/12/2019 CLINICAL DATA:  Left-sided chest pain for 10 minutes, history of pericarditis EXAM: CHEST - 2 VIEW COMPARISON:  12/19/2018 FINDINGS: Frontal and lateral views of the chest demonstrate an unremarkable cardiac silhouette. No airspace  disease, effusion, or pneumothorax. No acute bony abnormalities. IMPRESSION: 1. No acute intrathoracic process. Electronically Signed   By: Sharlet Salina M.D.   On: 05/12/2019 21:52   CT HEAD WO CONTRAST  Result Date: 05/16/2019 CLINICAL DATA:  Trauma, follow-up bleed. EXAM: CT HEAD WITHOUT CONTRAST TECHNIQUE: Contiguous axial images were obtained from the base of the skull through the vertex without intravenous contrast. COMPARISON:  05/16/2019 FINDINGS: Brain: Multiple areas of subcortical hemorrhage again noted in the left frontal lobe, right posterior parietal lobe and right occipital lobe. These are stable when compared to prior study. No hydrocephalus, mass effect or midline shift. Vascular: No hyperdense vessel or unexpected calcification. Skull: No acute calvarial abnormality. Sinuses/Orbits: Visualized paranasal sinuses and mastoids clear. Orbital soft tissues unremarkable. Other: None IMPRESSION: Areas of hemorrhage in the left frontal lobe, posterior right parietal lobe and right occipital lobe are unchanged since prior study. Electronically Signed   By: Charlett Nose M.D.   On: 05/16/2019 17:41   CT HEAD WO CONTRAST  Result Date: 05/16/2019 CLINICAL DATA:  Encephalopathy. Alcohol withdrawal. Recent head injury. EXAM: CT HEAD WITHOUT CONTRAST TECHNIQUE: Contiguous axial images were obtained from the base of the skull through the vertex without intravenous contrast. COMPARISON:  None. FINDINGS: Brain: Subcentimeter foci of subcortical hemorrhage with 2 located in the anterior left frontal lobe and 3 located in the right posterior cerebrum - occipital, low parietal, and posterior temporal region. This has a Coup/contrecoup pattern the largest is on the right at the level of the occipital lobe and measures 1 cm. Associated edema is mild. No infarct, hydrocephalus, or shift. Vascular: No hyperdense vessel or unexpected calcification. Skull: Negative for fracture or bone lesion. Sinuses/Orbits: No  evidence of injury Critical Value/emergent results were called by telephone at the time of interpretation on 05/16/2019 at 9:22 am to provider Eppie Gibson , who verbally acknowledged these results. IMPRESSION: Small subcortical hemorrhages in the left frontal and right posterior cerebrum with coup/contrecoup pattern that correlates with history of head trauma. The largest is 1 cm and there is no associated mass effect. Electronically Signed   By: Monte Fantasia M.D.   On: 05/16/2019 09:26   ECHOCARDIOGRAM COMPLETE  Result Date: 05/15/2019    ECHOCARDIOGRAM REPORT   Patient Name:   Rodney Smith Date of Exam: 05/15/2019 Medical Rec #:  [id number]      Height:       71.0 in Accession #:    [id number]     Weight:       178.6 lb Date of Birth:  1990/08/18      BSA:          2.009 m Patient Age:    28 years       BP:           127/80 mmHg Patient Gender: M              HR:           64 bpm. Exam Location:  [facility] Procedure: 2D Echo, Color Doppler and Cardiac Doppler Indications:     pericardial effusion 423.9  History:         Patient has no prior history of Echocardiogram examinations.                  Covid-19, pericarditis.  Sonographer:     Sherrie Sport RDCS (AE) Referring Phys:  [id number] Soledad Gerlach NIU Diagnosing Phys: Kathlyn Sacramento MD IMPRESSIONS  1. Left ventricular ejection fraction, by estimation, is 55 to 60%. The left ventricle has normal function. The left ventricle has no regional wall motion abnormalities. Left ventricular diastolic parameters were normal.  2. Right ventricular systolic function is normal. The right ventricular size is normal. There is normal pulmonary artery systolic pressure.  3. The mitral valve is normal in structure. No evidence of mitral valve regurgitation. No evidence of mitral stenosis.  4. The aortic valve is normal in structure. Aortic valve regurgitation is not visualized. No aortic stenosis is present.  5. The inferior vena cava is normal in size with greater than 50% respiratory  variability, suggesting right atrial pressure of 3 mmHg.  6. There is no evidence of pericardial effusion. FINDINGS  Left Ventricle: Left ventricular ejection fraction, by estimation, is 55 to 60%. The left ventricle has normal function. The left ventricle has no regional wall motion abnormalities. The left ventricular internal cavity size was normal in size. There is  no left ventricular hypertrophy. Left ventricular diastolic parameters were normal. Right Ventricle: The right ventricular size is normal. No increase in right ventricular wall thickness. Right ventricular systolic function is normal. There is normal pulmonary artery systolic pressure. The tricuspid regurgitant velocity is 1.60 m/s, and  with an assumed right atrial pressure  of 3 mmHg, the estimated right ventricular systolic pressure is 13.2 mmHg. Left Atrium: Left atrial size was normal in size. Right Atrium: Right atrial size was normal in size. Pericardium: There is no evidence of pericardial effusion. Mitral Valve: The mitral valve is normal in structure. Normal mobility of the mitral valve leaflets. No evidence of mitral valve regurgitation. No evidence of mitral valve stenosis. Tricuspid Valve: The tricuspid valve is normal in structure. Tricuspid valve regurgitation is not demonstrated. No evidence of tricuspid stenosis. Aortic Valve: The aortic valve is normal in structure. Aortic valve regurgitation is not visualized. No aortic stenosis is present. Aortic valve mean gradient measures 4.0 mmHg. Aortic valve peak gradient measures 4.8 mmHg. Aortic valve area, by VTI measures 3.37 cm. Pulmonic Valve: The pulmonic valve was normal in structure. Pulmonic valve regurgitation is not visualized. No evidence of pulmonic stenosis. Aorta: The aortic root is normal in size and structure. Venous: The inferior vena cava is normal in size with greater than 50% respiratory variability, suggesting right atrial pressure of 3 mmHg. IAS/Shunts: No atrial level  shunt detected by color flow Doppler.  LEFT VENTRICLE PLAX 2D LVIDd:         4.62 cm  Diastology LVIDs:         2.99 cm  LV e' lateral:   12.80 cm/s LV PW:         1.09 cm  LV E/e' lateral: 7.0 LV IVS:        0.77 cm  LV e' medial:    12.70 cm/s LVOT diam:     2.30 cm  LV E/e' medial:  7.0 LV SV:         76 LV SV Index:   38 LVOT Area:     4.15 cm  RIGHT VENTRICLE RV Basal diam:  4.21 cm RV S prime:     15.00 cm/s TAPSE (M-mode): 4.0 cm LEFT ATRIUM             Index       RIGHT ATRIUM           Index LA diam:        2.60 cm 1.29 cm/m  RA Area:     16.50 cm LA Vol (A2C):   37.4 ml 18.61 ml/m RA Volume:   46.50 ml  23.14 ml/m LA Vol (A4C):   34.9 ml 17.37 ml/m LA Biplane Vol: 36.2 ml 18.02 ml/m  AORTIC VALVE                   PULMONIC VALVE AV Area (Vmax):    3.13 cm    PV Vmax:        0.78 m/s AV Area (Vmean):   3.02 cm    PV Peak grad:   2.4 mmHg AV Area (VTI):     3.37 cm    RVOT Peak grad: 4 mmHg AV Vmax:           110.00 cm/s AV Vmean:          91.100 cm/s AV VTI:            0.227 m AV Peak Grad:      4.8 mmHg AV Mean Grad:      4.0 mmHg LVOT Vmax:         82.90 cm/s LVOT Vmean:        66.300 cm/s LVOT VTI:          0.184 m LVOT/AV VTI ratio: 0.81  AORTA Ao  Root diam: 3.30 cm MITRAL VALVE               TRICUSPID VALVE MV Area (PHT): 3.19 cm    TR Peak grad:   10.2 mmHg MV Decel Time: 238 msec    TR Vmax:        160.00 cm/s MV E velocity: 89.50 cm/s MV A velocity: 58.70 cm/s  SHUNTS MV E/A ratio:  1.52        Systemic VTI:  0.18 m                            Systemic Diam: 2.30 cm Lorine BearsMuhammad Arida MD Electronically signed by Lorine BearsMuhammad Arida MD Signature Date/Time: 05/15/2019/12:06:09 PM    Final        Subjective: Pt denies any complaints  Discharge Exam: Vitals:   05/15/19 2300 05/16/19 0745  BP: (!) 145/97 105/61  Pulse:  (!) 102  Resp:  17  Temp:  98 F (36.7 C)  SpO2:     Vitals:   05/15/19 0801 05/15/19 1500 05/15/19 2300 05/16/19 0745  BP: 127/80 113/65 (!) 145/97 105/61  Pulse: 64  85  (!) 102  Resp: 15   17  Temp: 98.2 F (36.8 C)   98 F (36.7 C)  TempSrc: Oral   Oral  SpO2: 98%     Weight:      Height:        General: Pt is alert, awake, not in acute distress. Disheveled  Cardiovascular: S1/S2 +, no rubs, no gallops Respiratory: CTA bilaterally, no wheezing, no rhonchi Abdominal: Soft, NT, ND, bowel sounds + Extremities: no edema, no cyanosis    The results of significant diagnostics from this hospitalization (including imaging, microbiology, ancillary and laboratory) are listed below for reference.     Microbiology: Recent Results (from the past 240 hour(s))  Respiratory Panel by RT PCR (Flu A&B, Covid) - Nasopharyngeal Swab     Status: None   Collection Time: 05/12/19 11:43 PM   Specimen: Nasopharyngeal Swab  Result Value Ref Range Status   SARS Coronavirus 2 by RT PCR NEGATIVE NEGATIVE Final    Comment: (NOTE) SARS-CoV-2 target nucleic acids are NOT DETECTED. The SARS-CoV-2 RNA is generally detectable in upper respiratoy specimens during the acute phase of infection. The lowest concentration of SARS-CoV-2 viral copies this assay can detect is 131 copies/mL. A negative result does not preclude SARS-Cov-2 infection and should not be used as the sole basis for treatment or other patient management decisions. A negative result may occur with  improper specimen collection/handling, submission of specimen other than nasopharyngeal swab, presence of viral mutation(s) within the areas targeted by this assay, and inadequate number of viral copies (<131 copies/mL). A negative result must be combined with clinical observations, patient history, and epidemiological information. The expected result is Negative. Fact Sheet for Patients:  https://www.moore.com/https://www.fda.gov/media/142436/download Fact Sheet for Healthcare Providers:  https://www.young.biz/https://www.fda.gov/media/142435/download This test is not yet ap proved or cleared by the [place] FDA and  has been authorized for  detection and/or diagnosis of SARS-CoV-2 by FDA under an Emergency Use Authorization (EUA). This EUA will remain  in effect (meaning this test can be used) for the duration of the COVID-19 declaration under Section 564(b)(1) of the Act, 21 U.S.C. section 360bbb-3(b)(1), unless the authorization is terminated or revoked sooner.    Influenza A by PCR NEGATIVE NEGATIVE Final   Influenza B by PCR NEGATIVE NEGATIVE Final    Comment: (  NOTE) The Xpert Xpress SARS-CoV-2/FLU/RSV assay is intended as an aid in  the diagnosis of influenza from Nasopharyngeal swab specimens and  should not be used as a sole basis for treatment. Nasal washings and  aspirates are unacceptable for Xpert Xpress SARS-CoV-2/FLU/RSV  testing. Fact Sheet for Patients: https://www.moore.com/ Fact Sheet for Healthcare Providers: https://www.young.biz/ This test is not yet approved or cleared by the [place] FDA and  has been authorized for detection and/or diagnosis of SARS-CoV-2 by  FDA under an Emergency Use Authorization (EUA). This EUA will remain  in effect (meaning this test can be used) for the duration of the  Covid-19 declaration under Section 564(b)(1) of the Act, 21  U.S.C. section 360bbb-3(b)(1), unless the authorization is  terminated or revoked. Performed at [facility], [address] [place], [place] [address]   Respiratory Panel by RT PCR (Flu A&B, Covid) - Nasopharyngeal Swab     Status: None   Collection Time: 05/14/19  3:05 PM   Specimen: Nasopharyngeal Swab  Result Value Ref Range Status   SARS Coronavirus 2 by RT PCR NEGATIVE NEGATIVE Final    Comment: (NOTE) SARS-CoV-2 target nucleic acids are NOT DETECTED. The SARS-CoV-2 RNA is generally detectable in upper respiratoy specimens during the acute phase of infection. The lowest concentration of SARS-CoV-2 viral copies this assay can detect is 131 copies/mL. A negative result does not  preclude SARS-Cov-2 infection and should not be used as the sole basis for treatment or other patient management decisions. A negative result may occur with  improper specimen collection/handling, submission of specimen other than nasopharyngeal swab, presence of viral mutation(s) within the areas targeted by this assay, and inadequate number of viral copies (<131 copies/mL). A negative result must be combined with clinical observations, patient history, and epidemiological information. The expected result is Negative. Fact Sheet for Patients:  https://www.moore.com/ Fact Sheet for Healthcare Providers:  https://www.young.biz/ This test is not yet ap proved or cleared by the [place] FDA and  has been authorized for detection and/or diagnosis of SARS-CoV-2 by FDA under an Emergency Use Authorization (EUA). This EUA will remain  in effect (meaning this test can be used) for the duration of the COVID-19 declaration under Section 564(b)(1) of the Act, 21 U.S.C. section 360bbb-3(b)(1), unless the authorization is terminated or revoked sooner.    Influenza A by PCR NEGATIVE NEGATIVE Final   Influenza B by PCR NEGATIVE NEGATIVE Final    Comment: (NOTE) The Xpert Xpress SARS-CoV-2/FLU/RSV assay is intended as an aid in  the diagnosis of influenza from Nasopharyngeal swab specimens and  should not be used as a sole basis for treatment. Nasal washings and  aspirates are unacceptable for Xpert Xpress SARS-CoV-2/FLU/RSV  testing. Fact Sheet for Patients: https://www.moore.com/ Fact Sheet for Healthcare Providers: https://www.young.biz/ This test is not yet approved or cleared by the [place] FDA and  has been authorized for detection and/or diagnosis of SARS-CoV-2 by  FDA under an Emergency Use Authorization (EUA). This EUA will remain  in effect (meaning this test can be used) for the duration of the   Covid-19 declaration under Section 564(b)(1) of the Act, 21  U.S.C. section 360bbb-3(b)(1), unless the authorization is  terminated or revoked. Performed at [facility], [address] [place], [place] [address]      Labs: BNP (last 3 results) No results for input(s): BNP in the last 8760 hours. Basic Metabolic Panel: Recent Labs  Lab 05/12/19 2140 05/13/19 0981 05/14/19 1348 05/15/19 0457 05/16/19 1914  NA 134* 137 140 141 140  K 3.2* 3.9 3.3* 3.7 3.7  CL 96* 105 106 109 106  CO2 22 24 19* 25 25  GLUCOSE 94 93 94 86 117*  BUN 10 13 <5* 6 6  CREATININE 0.82 0.75 0.63 0.65 0.55*  CALCIUM 9.6 8.4* 9.4 8.6* 8.9  MG  --  1.7 1.5*  --   --   PHOS  --  3.9 3.0  --   --    Liver Function Tests: Recent Labs  Lab 05/13/19 0549 05/14/19 1348 05/16/19 0311  AST 82* 94* 55*  ALT 87* 99* 71*  ALKPHOS 80 81  --   BILITOT 1.2 0.9 0.7  PROT 6.7 7.7  --   ALBUMIN 4.0 4.8  --    No results for input(s): LIPASE, AMYLASE in the last 168 hours. No results for input(s): AMMONIA in the last 168 hours. CBC: Recent Labs  Lab 05/12/19 2140 05/13/19 0549 05/14/19 1348 05/15/19 0457 05/16/19 0311  WBC 7.2 5.7 9.4 6.2 4.9  HGB 17.2* 14.2 14.7 13.0 13.5  HCT 48.8 39.2 42.3 37.4* 37.5*  MCV 92.8 91.6 93.6 94.9 92.8  PLT 228 173 179 134* 133*   Cardiac Enzymes: No results for input(s): CKTOTAL, CKMB, CKMBINDEX, TROPONINI in the last 168 hours. BNP: Invalid input(s): POCBNP CBG: Recent Labs  Lab 05/13/19 0748  GLUCAP 101*   D-Dimer No results for input(s): DDIMER in the last 72 hours. Hgb A1c No results for input(s): HGBA1C in the last 72 hours. Lipid Profile No results for input(s): CHOL, HDL, LDLCALC, TRIG, CHOLHDL, LDLDIRECT in the last 72 hours. Thyroid function studies No results for input(s): TSH, T4TOTAL, T3FREE, THYROIDAB in the last 72 hours.  Invalid input(s): FREET3 Anemia work up No results for input(s): VITAMINB12, FOLATE, FERRITIN, TIBC,  IRON, RETICCTPCT in the last 72 hours. Urinalysis No results found for: COLORURINE, APPEARANCEUR, LABSPEC, PHURINE, GLUCOSEU, HGBUR, BILIRUBINUR, KETONESUR, PROTEINUR, UROBILINOGEN, NITRITE, LEUKOCYTESUR Sepsis Labs Invalid input(s): PROCALCITONIN,  WBC,  LACTICIDVEN Microbiology Recent Results (from the past 240 hour(s))  Respiratory Panel by RT PCR (Flu A&B, Covid) - Nasopharyngeal Swab     Status: None   Collection Time: 05/12/19 11:43 PM   Specimen: Nasopharyngeal Swab  Result Value Ref Range Status   SARS Coronavirus 2 by RT PCR NEGATIVE NEGATIVE Final    Comment: (NOTE) SARS-CoV-2 target nucleic acids are NOT DETECTED. The SARS-CoV-2 RNA is generally detectable in upper respiratoy specimens during the acute phase of infection. The lowest concentration of SARS-CoV-2 viral copies this assay can detect is 131 copies/mL. A negative result does not preclude SARS-Cov-2 infection and should not be used as the sole basis for treatment or other patient management decisions. A negative result may occur with  improper specimen collection/handling, submission of specimen other than nasopharyngeal swab, presence of viral mutation(s) within the areas targeted by this assay, and inadequate number of viral copies (<131 copies/mL). A negative result must be combined with clinical observations, patient history, and epidemiological information. The expected result is Negative. Fact Sheet for Patients:  https://www.moore.com/ Fact Sheet for Healthcare Providers:  https://www.young.biz/ This test is not yet ap proved or cleared by the [place] FDA and  has been authorized for detection and/or diagnosis of SARS-CoV-2 by FDA under an Emergency Use Authorization (EUA). This EUA will remain  in effect (meaning this test can be used) for the duration of the COVID-19 declaration under Section 564(b)(1) of the Act, 21 U.S.C. section 360bbb-3(b)(1), unless the  authorization is  terminated or revoked sooner.    Influenza A by PCR NEGATIVE NEGATIVE Final   Influenza B by PCR NEGATIVE NEGATIVE Final    Comment: (NOTE) The Xpert Xpress SARS-CoV-2/FLU/RSV assay is intended as an aid in  the diagnosis of influenza from Nasopharyngeal swab specimens and  should not be used as a sole basis for treatment. Nasal washings and  aspirates are unacceptable for Xpert Xpress SARS-CoV-2/FLU/RSV  testing. Fact Sheet for Patients: https://www.moore.com/ Fact Sheet for Healthcare Providers: https://www.young.biz/ This test is not yet approved or cleared by the [place] FDA and  has been authorized for detection and/or diagnosis of SARS-CoV-2 by  FDA under an Emergency Use Authorization (EUA). This EUA will remain  in effect (meaning this test can be used) for the duration of the  Covid-19 declaration under Section 564(b)(1) of the Act, 21  U.S.C. section 360bbb-3(b)(1), unless the authorization is  terminated or revoked. Performed at [facility] [facility], [address] [place], [place] [address]   Respiratory Panel by RT PCR (Flu A&B, Covid) - Nasopharyngeal Swab     Status: None   Collection Time: 05/14/19  3:05 PM   Specimen: Nasopharyngeal Swab  Result Value Ref Range Status   SARS Coronavirus 2 by RT PCR NEGATIVE NEGATIVE Final    Comment: (NOTE) SARS-CoV-2 target nucleic acids are NOT DETECTED. The SARS-CoV-2 RNA is generally detectable in upper respiratoy specimens during the acute phase of infection. The lowest concentration of SARS-CoV-2 viral copies this assay can detect is 131 copies/mL. A negative result does not preclude SARS-Cov-2 infection and should not be used as the sole basis for treatment or other patient management decisions. A negative result may occur with  improper specimen collection/handling, submission of specimen other than nasopharyngeal swab, presence of viral mutation(s) within  the areas targeted by this assay, and inadequate number of viral copies (<131 copies/mL). A negative result must be combined with clinical observations, patient history, and epidemiological information. The expected result is Negative. Fact Sheet for Patients:  https://www.moore.com/ Fact Sheet for Healthcare Providers:  https://www.young.biz/ This test is not yet ap proved or cleared by the [place] FDA and  has been authorized for detection and/or diagnosis of SARS-CoV-2 by FDA under an Emergency Use Authorization (EUA). This EUA will remain  in effect (meaning this test can be used) for the duration of the COVID-19 declaration under Section 564(b)(1) of the Act, 21 U.S.C. section 360bbb-3(b)(1), unless the authorization is terminated or revoked sooner.    Influenza A by PCR NEGATIVE NEGATIVE Final   Influenza B by PCR NEGATIVE NEGATIVE Final    Comment: (NOTE) The Xpert Xpress SARS-CoV-2/FLU/RSV assay is intended as an aid in  the diagnosis of influenza from Nasopharyngeal swab specimens and  should not be used as a sole basis for treatment. Nasal washings and  aspirates are unacceptable for Xpert Xpress SARS-CoV-2/FLU/RSV  testing. Fact Sheet for Patients: https://www.moore.com/ Fact Sheet for Healthcare Providers: https://www.young.biz/ This test is not yet approved or cleared by the [place] FDA and  has been authorized for detection and/or diagnosis of SARS-CoV-2 by  FDA under an Emergency Use Authorization (EUA). This EUA will remain  in effect (meaning this test can be used) for the duration of the  Covid-19 declaration under Section 564(b)(1) of the Act, 21  U.S.C. section 360bbb-3(b)(1), unless the authorization is  terminated or revoked. Performed at [facility], [address] [place], [place] [address]      Time coordinating discharge: Over 30  minutes  SIGNED:   Charise Killian, MD  [facility] 05/16/2019, 6:47 PM Pager   If 7PM-7AM, please contact night-coverage [email or web address]

## 2019-05-16 NOTE — Consult Note (Signed)
Neurosurgery-New Consultation Evaluation 05/16/2019 Halvor Behrend [id number]  Identifying Statement: Rodney Smith is a 28 y.o. male from [place] [address] with abnormal CT head imaging  Physician Requesting Consultation: Eppie Gibson, MD  History of Present Illness: Rodney Smith is here for evaluation after being admitted for concern for alcohol withdrawal.  He had a CT scan of the head as part of the evaluation and this did reveal some punctate hemorrhages concerning for contusions.  He states that he has not had any recent falls or other trauma.  He denies any speech problems, weakness, numbness, headaches, or vision changes.  He does not recall any recent neurologic symptoms or seizures.  Past Medical History:  Past Medical History:  Diagnosis Date  . COVID-19   . Pericarditis     Social History: Social History   Socioeconomic History  . Marital status: Single    Spouse name: Not on file  . Number of children: Not on file  . Years of education: Not on file  . Highest education level: Not on file  Occupational History  . Not on file  Tobacco Use  . Smoking status: Current Some Day Smoker    Types: Cigarettes  . Smokeless tobacco: Never Used  . Tobacco comment: Smokes occasionally  Substance and Sexual Activity  . Alcohol use: Yes    Alcohol/week: 84.0 standard drinks    Types: 84 Cans of beer per week  . Drug use: Never  . Sexual activity: Not on file  Other Topics Concern  . Not on file  Social History Narrative  . Not on file   Social Determinants of Health   Financial Resource Strain:   . Difficulty of Paying Living Expenses:   Food Insecurity:   . Worried About Charity fundraiser in the Last Year:   . Arboriculturist in the Last Year:   Transportation Needs:   . Film/video editor (Medical):   Marland Kitchen Lack of Transportation (Non-Medical):   Physical Activity:   . Days of Exercise per Week:   . Minutes of Exercise per Session:   Stress:   .  Feeling of Stress :   Social Connections:   . Frequency of Communication with Friends and Family:   . Frequency of Social Gatherings with Friends and Family:   . Attends Religious Services:   . Active Member of Clubs or Organizations:   . Attends Archivist Meetings:   Marland Kitchen Marital Status:   Intimate Partner Violence:   . Fear of Current or Ex-Partner:   . Emotionally Abused:   Marland Kitchen Physically Abused:   . Sexually Abused:      Family History: Family History  Problem Relation Age of Onset  . Heart attack Father   . CAD Paternal Grandfather     Review of Systems:  Review of Systems - General ROS: Negative Psychological ROS: Negative Ophthalmic ROS: Negative ENT ROS: Negative Hematological and Lymphatic ROS: Negative  Endocrine ROS: Negative Respiratory ROS: Negative Cardiovascular ROS: Negative Gastrointestinal ROS: Negative Genito-Urinary ROS: Negative Musculoskeletal ROS: Negative Neurological ROS: Negative for headache or seizures Dermatological ROS: Negative  Physical Exam: BP 105/61 (BP Location: Left Arm)   Pulse (!) 102   Temp 98 F (36.7 C) (Oral)   Resp 17   Ht 5\' 11"  (1.803 m)   Wt 81 kg   SpO2 98%   BMI 24.91 kg/m  Body mass index is 24.91 kg/m. Body surface area is 2.01 meters squared. General appearance: Alert, cooperative, in  no acute distress Head: Normocephalic, atraumatic Eyes: Normal, EOM intact Ext: No edema in LE bilaterally, warm extremities  Neurologic exam:  Mental status: alertness: alert, orientation: person, place, time, affect: normal Speech: fluent and clear, naming and repetition are intact Cranial nerves:  II: Visual fields are full by confrontation, no ptosis III/IV/VI: extra-ocular motions intact bilaterally V/VII:no evidence of facial droop or weakness  VIII: hearing normal XI: trapezius strength symmetric,  sternocleidomastoid strength symmetric XII: tongue strength symmetric  Motor:strength symmetric 5/5 in all  extremities with no pronator drift Sensory: intact to light touch in all extremities Gait: normal   Laboratory: Results for orders placed or performed during the hospital encounter of 05/14/19  Respiratory Panel by RT PCR (Flu A&B, Covid) - Nasopharyngeal Swab   Specimen: Nasopharyngeal Swab  Result Value Ref Range   SARS Coronavirus 2 by RT PCR NEGATIVE NEGATIVE   Influenza A by PCR NEGATIVE NEGATIVE   Influenza B by PCR NEGATIVE NEGATIVE  Comprehensive metabolic panel  Result Value Ref Range   Sodium 140 135 - 145 mmol/L   Potassium 3.3 (L) 3.5 - 5.1 mmol/L   Chloride 106 98 - 111 mmol/L   CO2 19 (L) 22 - 32 mmol/L   Glucose, Bld 94 70 - 99 mg/dL   BUN <5 (L) 6 - 20 mg/dL   Creatinine, Ser 7.61 0.61 - 1.24 mg/dL   Calcium 9.4 8.9 - 95.0 mg/dL   Total Protein 7.7 6.5 - 8.1 g/dL   Albumin 4.8 3.5 - 5.0 g/dL   AST 94 (H) 15 - 41 U/L   ALT 99 (H) 0 - 44 U/L   Alkaline Phosphatase 81 38 - 126 U/L   Total Bilirubin 0.9 0.3 - 1.2 mg/dL   GFR calc non Af Amer >60 >60 mL/min   GFR calc Af Amer >60 >60 mL/min   Anion gap 15 5 - 15  Ethanol  Result Value Ref Range   Alcohol, Ethyl (B) 102 (H) <10 mg/dL  Salicylate level  Result Value Ref Range   Salicylate Lvl <7.0 (L) 7.0 - 30.0 mg/dL  Acetaminophen level  Result Value Ref Range   Acetaminophen (Tylenol), Serum <10 (L) 10 - 30 ug/mL  cbc  Result Value Ref Range   WBC 9.4 4.0 - 10.5 K/uL   RBC 4.52 4.22 - 5.81 MIL/uL   Hemoglobin 14.7 13.0 - 17.0 g/dL   HCT 93.2 67.1 - 24.5 %   MCV 93.6 80.0 - 100.0 fL   MCH 32.5 26.0 - 34.0 pg   MCHC 34.8 30.0 - 36.0 g/dL   RDW 80.9 98.3 - 38.2 %   Platelets 179 150 - 400 K/uL   nRBC 0.0 0.0 - 0.2 %  Urine Drug Screen, Qualitative  Result Value Ref Range   Tricyclic, Ur Screen NONE DETECTED NONE DETECTED   Amphetamines, Ur Screen NONE DETECTED NONE DETECTED   MDMA (Ecstasy)Ur Screen NONE DETECTED NONE DETECTED   Cocaine Metabolite,Ur [place] NONE DETECTED NONE DETECTED   Opiate, Ur Screen  NONE DETECTED NONE DETECTED   Phencyclidine (PCP) Ur S NONE DETECTED NONE DETECTED   Cannabinoid 50 Ng, Ur [place] NONE DETECTED NONE DETECTED   Barbiturates, Ur Screen NONE DETECTED NONE DETECTED   Benzodiazepine, Ur Scrn POSITIVE (A) NONE DETECTED   Methadone Scn, Ur NONE DETECTED NONE DETECTED  Hepatitis panel, acute  Result Value Ref Range   Hepatitis B Surface Ag NEGATIVE (A) NON REACTIVE   HCV Ab >11.0 (H) 0.0 - 0.9 s/co ratio  Hep A IgM Negative (A) Negative   Hep B C IgM NEGATIVE (A) NON REACTIVE  Magnesium  Result Value Ref Range   Magnesium 1.5 (L) 1.7 - 2.4 mg/dL  Phosphorus  Result Value Ref Range   Phosphorus 3.0 2.5 - 4.6 mg/dL  Basic metabolic panel  Result Value Ref Range   Sodium 141 135 - 145 mmol/L   Potassium 3.7 3.5 - 5.1 mmol/L   Chloride 109 98 - 111 mmol/L   CO2 25 22 - 32 mmol/L   Glucose, Bld 86 70 - 99 mg/dL   BUN 6 6 - 20 mg/dL   Creatinine, Ser 0.63 0.61 - 1.24 mg/dL   Calcium 8.6 (L) 8.9 - 10.3 mg/dL   GFR calc non Af Amer >60 >60 mL/min   GFR calc Af Amer >60 >60 mL/min   Anion gap 7 5 - 15  CBC  Result Value Ref Range   WBC 6.2 4.0 - 10.5 K/uL   RBC 3.94 (L) 4.22 - 5.81 MIL/uL   Hemoglobin 13.0 13.0 - 17.0 g/dL   HCT 01.6 (L) 01.0 - 93.2 %   MCV 94.9 80.0 - 100.0 fL   MCH 33.0 26.0 - 34.0 pg   MCHC 34.8 30.0 - 36.0 g/dL   RDW 35.5 73.2 - 20.2 %   Platelets 134 (L) 150 - 400 K/uL   nRBC 0.0 0.0 - 0.2 %  CBC  Result Value Ref Range   WBC 4.9 4.0 - 10.5 K/uL   RBC 4.04 (L) 4.22 - 5.81 MIL/uL   Hemoglobin 13.5 13.0 - 17.0 g/dL   HCT 54.2 (L) 70.6 - 23.7 %   MCV 92.8 80.0 - 100.0 fL   MCH 33.4 26.0 - 34.0 pg   MCHC 36.0 30.0 - 36.0 g/dL   RDW 62.8 31.5 - 17.6 %   Platelets 133 (L) 150 - 400 K/uL   nRBC 0.0 0.0 - 0.2 %  Basic metabolic panel  Result Value Ref Range   Sodium 140 135 - 145 mmol/L   Potassium 3.7 3.5 - 5.1 mmol/L   Chloride 106 98 - 111 mmol/L   CO2 25 22 - 32 mmol/L   Glucose, Bld 117 (H) 70 - 99 mg/dL   BUN 6 6 - 20  mg/dL   Creatinine, Ser 1.60 (L) 0.61 - 1.24 mg/dL   Calcium 8.9 8.9 - 73.7 mg/dL   GFR calc non Af Amer >60 >60 mL/min   GFR calc Af Amer >60 >60 mL/min   Anion gap 9 5 - 15  AST  Result Value Ref Range   AST 55 (H) 15 - 41 U/L  ALT  Result Value Ref Range   ALT 71 (H) 0 - 44 U/L  Bilirubin, total  Result Value Ref Range   Total Bilirubin 0.7 0.3 - 1.2 mg/dL  ECHOCARDIOGRAM COMPLETE  Result Value Ref Range   Weight 2,857.16 oz   Height 71 in   BP 127/80 mmHg   I personally reviewed labs  Imaging: CT head: Small subcortical hemorrhages in the left frontal and right posterior cerebrum with coup/contrecoup pattern that correlates with history of head trauma. The largest is 1 cm and there is no associated mass effect.  Impression/Plan:  Rodney Smith is here for evaluation of abnormal CT head imaging.  He is at his neurologic baseline and now shows no neurologic deficits.  We did review the imaging has concern for some small hemorrhages noted in multiple areas but given no recent trauma.  Given  that we have identified these, I would recommend repeat CT head to ensure that there is no enlargement of these.  If not, he is safe for discharge and no intervention needed but he may need an MRI of the brain as an outpatient in order to further evaluate.   1.  Diagnosis: Abnormal CT of the head  2.  Plan -Recommend repeat CT of the head today and if normal, patient is safe for discharge -Recommend follow-up with PCP for further evaluation

## 2020-11-02 ENCOUNTER — Emergency Department (HOSPITAL_COMMUNITY)
Admission: EM | Admit: 2020-11-02 | Discharge: 2020-11-02 | Disposition: A | Discharge status: Home / Self Care | Payer: Self-pay | Attending: Emergency Medicine | Admitting: Emergency Medicine

## 2020-11-02 ENCOUNTER — Other Ambulatory Visit: Payer: Self-pay

## 2020-11-02 ENCOUNTER — Emergency Department (HOSPITAL_COMMUNITY): Payer: Self-pay

## 2020-11-02 ENCOUNTER — Encounter (HOSPITAL_COMMUNITY): Payer: Self-pay | Admitting: Emergency Medicine

## 2020-11-02 DIAGNOSIS — Y908 Blood alcohol level of 240 mg/100 ml or more: Secondary | ICD-10-CM | POA: Insufficient documentation

## 2020-11-02 DIAGNOSIS — F10929 Alcohol use, unspecified with intoxication, unspecified: Secondary | ICD-10-CM

## 2020-11-02 DIAGNOSIS — F17210 Nicotine dependence, cigarettes, uncomplicated: Secondary | ICD-10-CM | POA: Insufficient documentation

## 2020-11-02 DIAGNOSIS — R519 Headache, unspecified: Secondary | ICD-10-CM | POA: Insufficient documentation

## 2020-11-02 DIAGNOSIS — F10229 Alcohol dependence with intoxication, unspecified: Secondary | ICD-10-CM | POA: Insufficient documentation

## 2020-11-02 DIAGNOSIS — Z8616 Personal history of COVID-19: Secondary | ICD-10-CM | POA: Insufficient documentation

## 2020-11-02 LAB — RAPID URINE DRUG SCREEN, HOSP PERFORMED
Amphetamines: NOT DETECTED
Barbiturates: NOT DETECTED
Benzodiazepines: POSITIVE — AB
Cocaine: NOT DETECTED
Opiates: NOT DETECTED
Tetrahydrocannabinol: POSITIVE — AB

## 2020-11-02 LAB — COMPREHENSIVE METABOLIC PANEL
ALT: 20 U/L (ref 0–44)
AST: 25 U/L (ref 15–41)
Albumin: 4.1 g/dL (ref 3.5–5.0)
Alkaline Phosphatase: 82 U/L (ref 38–126)
Anion gap: 8 (ref 5–15)
BUN: <5 mg/dL — ABNORMAL LOW (ref 6–20)
CO2: 27 mmol/L (ref 22–32)
Calcium: 8.1 mg/dL — ABNORMAL LOW (ref 8.9–10.3)
Chloride: 107 mmol/L (ref 98–111)
Creatinine, Ser: 0.64 mg/dL (ref 0.61–1.24)
GFR, Estimated: >60 mL/min (ref >60)
Glucose, Bld: 112 mg/dL — ABNORMAL HIGH (ref 70–99)
Potassium: 3.9 mmol/L (ref 3.5–5.1)
Sodium: 142 mmol/L (ref 135–145)
Total Bilirubin: 0.4 mg/dL (ref 0.3–1.2)
Total Protein: 7.1 g/dL (ref 6.5–8.1)

## 2020-11-02 LAB — CBC
HCT: 44.3 % (ref 39.0–52.0)
Hemoglobin: 15.3 g/dL (ref 13.0–17.0)
MCH: 32.9 pg (ref 26.0–34.0)
MCHC: 34.5 g/dL (ref 30.0–36.0)
MCV: 95.3 fL (ref 80.0–100.0)
Platelets: 257 10*3/uL (ref 150–400)
RBC: 4.65 MIL/uL (ref 4.22–5.81)
RDW: 14.3 % (ref 11.5–15.5)
WBC: 6 10*3/uL (ref 4.0–10.5)
nRBC: 0 % (ref 0.0–0.2)

## 2020-11-02 LAB — ETHANOL: Alcohol, Ethyl (B): 281 mg/dL — ABNORMAL HIGH (ref <10)

## 2020-11-02 LAB — TROPONIN I (HIGH SENSITIVITY)
Troponin I (High Sensitivity): 3 ng/L (ref <18)
Troponin I (High Sensitivity): <2 ng/L (ref <18)

## 2020-11-02 MED ORDER — ALUM & MAG HYDROXIDE-SIMETH 200-200-20 MG/5ML PO SUSP: 30.0000 mL | Freq: Once | ORAL | Status: DC

## 2020-11-02 MED ORDER — SODIUM CHLORIDE 0.9 % IV BOLUS
1000.0000 mL | Freq: Once | INTRAVENOUS | Status: AC
  Administered 2020-11-02: 1000 mL via INTRAVENOUS

## 2020-11-02 MED ORDER — LIDOCAINE VISCOUS HCL 2 % MT SOLN: 15.0000 mL | Freq: Once | OROMUCOSAL | Status: DC

## 2020-11-02 NOTE — ED Notes (Addendum)
Pt has been asked multiple times by staff to stay seated for his safety. However, pt continues to walk around unsteadily and has told staff that he going to "step outside for some air."

## 2020-11-02 NOTE — ED Provider Notes (Signed)
Emergency Medicine Provider Triage Evaluation Note  Rodney Smith , a 30 y.o. male  was evaluated in triage.  Pt complains of chest pain that is sharp, centrally located, associated with numbness of left arm, nausea, vomiting. Hx of pericarditis. FHx of ACS. No hx of diabetes. History of cigarette smoking. Hx of seizures, heavy alcohol use at one 5th/day. On chronic librium, but stopped taking it one month ago. Patient reports tonic clonic seizure earlier today. No injury, did not bite tongue. No SOB.  Review of Systems  Positive: As above Negative: As above  Physical Exam  BP 120/87 (BP Location: Right Arm)   Pulse (!) 102   Temp 97.8 F (36.6 C) (Oral)   Resp 18   SpO2 97%  Gen:   Awake, tearful, anxious and distressed Resp:  Normal effort  MSK:   Moves extremities without difficulty  Other:  Normal heart sounds, mild tachycardia  Medical Decision Making  Medically screening exam initiated at 1:57 PM.  Appropriate orders placed.  Rodney Smith was informed that the remainder of the evaluation will be completed by another provider, this initial triage assessment does not replace that evaluation, and the importance of remaining in the ED until their evaluation is complete.  Chest pain, seizure, anxiety   Olene Floss, PA-C 11/02/20 1400    Tegeler, Canary Brim, MD 11/02/20 2019

## 2020-11-02 NOTE — ED Provider Notes (Signed)
[facility] EMERGENCY DEPARTMENT Provider Note   CSN: [id number] Arrival date & time: 11/02/20  1321     History Chief Complaint  Patient presents with   Chest Pain    Rodney Smith is a 30 y.o. male.  The history is provided by the patient and medical records. No language interpreter was used.  Chest Pain 29 year old male significant history of pericarditis after having COVID last year, alcohol use, tobacco use, who presents evaluation of chest pain.  Patient does not provide adequate history and is a poor historian.  Patient states he may have blacked out today.  He initially did not have any specific complaint but when asked, he endorsed having headache, pain in his chest, and abdominal pain of unknown duration.  Stated pain is mild at this time.  Admits to alcohol use and states he last use was yesterday, denies any drug use.  Denies tongue biting or urinary or bowel incontinence.    Past Medical History:  Diagnosis Date   COVID-19    Pericarditis     Patient Active Problem List   Diagnosis Date Noted   Encephalopathy 05/15/2019   Acute metabolic encephalopathy 05/14/2019   Hypokalemia 05/14/2019   Abnormal LFTs 05/14/2019   Pericarditis    Alcohol withdrawal (HCC) 05/13/2019   Atypical chest pain 05/13/2019    History reviewed. No pertinent surgical history.     Family History  Problem Relation Age of Onset   Heart attack Father    CAD Paternal Grandfather     Social History   Tobacco Use   Smoking status: Some Days    Types: Cigarettes   Smokeless tobacco: Never   Tobacco comments:    Smokes occasionally  Substance Use Topics   Alcohol use: Yes    Alcohol/week: 84.0 standard drinks    Types: 84 Cans of beer per week   Drug use: Never    Home Medications Prior to Admission medications   Not on File    Allergies    Patient has no known allergies.  Review of Systems   Review of Systems  Unable to perform ROS: Mental  status change  Cardiovascular:  Positive for chest pain.   Physical Exam Updated Vital Signs BP 120/87 (BP Location: Right Arm)   Pulse (!) 102   Temp 97.8 F (36.6 C) (Oral)   Resp 18   SpO2 97%   Physical Exam Vitals and nursing note reviewed.  Constitutional:      General: He is not in acute distress.    Appearance: He is well-developed.  HENT:     Head: Atraumatic.  Eyes:     Conjunctiva/sclera: Conjunctivae normal.  Cardiovascular:     Rate and Rhythm: Tachycardia present.  Pulmonary:     Effort: Pulmonary effort is normal.     Breath sounds: Normal breath sounds.  Abdominal:     General: Abdomen is flat.     Palpations: Abdomen is soft.     Tenderness: There is no abdominal tenderness.  Musculoskeletal:     Cervical back: Neck supple.     Comments: Able to move all 4 extremities without difficulty  Skin:    Findings: No rash.  Neurological:     Mental Status: He is alert.     GCS: GCS eye subscore is 4. GCS verbal subscore is 5. GCS motor subscore is 6.     Comments: Alert and oriented x3  Psychiatric:        Mood and Affect:  Mood normal.    ED Results / Procedures / Treatments   Labs (all labs ordered are listed, but only abnormal results are displayed) Labs Reviewed  COMPREHENSIVE METABOLIC PANEL - Abnormal; Notable for the following components:      Result Value   Glucose, Bld 112 (*)    BUN <5 (*)    Calcium 8.1 (*)    All other components within normal limits  RAPID URINE DRUG SCREEN, HOSP PERFORMED - Abnormal; Notable for the following components:   Benzodiazepines POSITIVE (*)    Tetrahydrocannabinol POSITIVE (*)    All other components within normal limits  ETHANOL - Abnormal; Notable for the following components:   Alcohol, Ethyl (B) 281 (*)    All other components within normal limits  CBC  TROPONIN I (HIGH SENSITIVITY)  TROPONIN I (HIGH SENSITIVITY)    EKG None  Radiology DG Chest 2 View  Result Date: 11/02/2020 CLINICAL DATA:   Left-sided chest pain for 2-3 days. EXAM: CHEST - 2 VIEW COMPARISON:  Radiograph and CT 08/29/2020 FINDINGS: The cardiomediastinal contours are normal. The lungs are clear. Pulmonary vasculature is normal. No consolidation, pleural effusion, or pneumothorax. No acute osseous abnormalities are seen. IMPRESSION: Negative radiographs of the chest. Electronically Signed   By: Narda Rutherford M.D.   On: 11/02/2020 15:12    Procedures Procedures   Medications Ordered in ED Medications  alum & mag hydroxide-simeth (MAALOX/MYLANTA) 200-200-20 MG/5ML suspension 30 mL (has no administration in time range)    And  lidocaine (XYLOCAINE) 2 % viscous mouth solution 15 mL (has no administration in time range)  sodium chloride 0.9 % bolus 1,000 mL (1,000 mLs Intravenous New Bag/Given 11/02/20 2108)    ED Course  I have reviewed the triage vital signs and the nursing notes.  Pertinent labs & imaging results that were available during my care of the patient were reviewed by me and considered in my medical decision making (see chart for details).    MDM Rules/Calculators/A&P                           BP 110/80   Pulse 89   Temp 97.8 F (36.6 C) (Oral)   Resp 16   SpO2 96%   Final Clinical Impression(s) / ED Diagnoses Final diagnoses:  Alcoholic intoxication with complication (HCC)    Rx / DC Orders ED Discharge Orders     None      Patient endorsed having pain in his chest, abdominal, headache and apparently had a "blackout episode".  Does have history of alcohol abuse and history alcohol-related seizure not compliant with his medication.  Symptoms not consistent with ACS.  He is a poor historian but appears to be in no acute discomfort.  10:55 PM Labs remarkable for evidence of alcohol intoxication with an alcohol level of 281.  UDS positive for benzodiazepine and tetrahydrocannabinol.  EKG and delta troponin unremarkable, chest x-ray unremarkable, labs are reassuring.  Patient has been  monitored in the ED for approximately 10 hours.  He is clinically sober and can be discharged.   Fayrene Helper, PA-C 11/02/20 2326    Lorre Nick, MD 11/05/20 [phone]

## 2020-11-02 NOTE — ED Triage Notes (Signed)
Pt to triage via [facility] from street.  Reports chest pain with history of pericarditis.  Hasn't followed up with cardiologist.  Also hasn't taken seizure meds in over 1 month and has been having seizures.  Last one yesterday.  Reports heavy ETOH.    20g L AC NS 500cc bolus

## 2021-11-08 IMAGING — CT CT HEAD W/O CM
3 series · 15 of 47 positions shown, 18 images · non-contrast
Comparison: 05/16/2019

CLINICAL DATA: Trauma, follow-up bleed.

EXAM:
CT HEAD WITHOUT CONTRAST
TECHNIQUE: Contiguous axial images were obtained from the base of the skull
through the vertex without intravenous contrast.

[Series 2: head wo · axial · 0.47mm/px · z∈[-175,-50]mm · 9 of 31 slices shown, 12 images]
[im 3/31  brain]
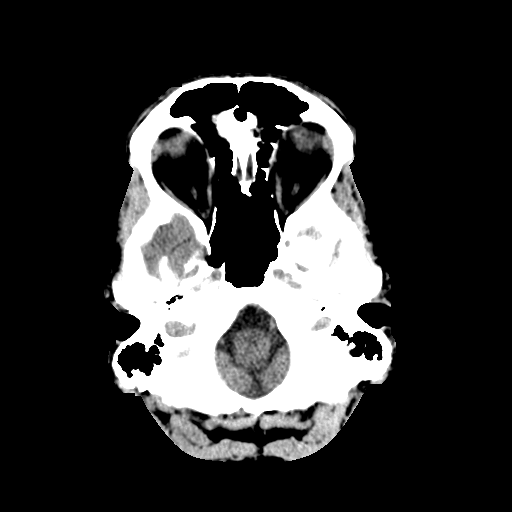
[im 3/31  bone]
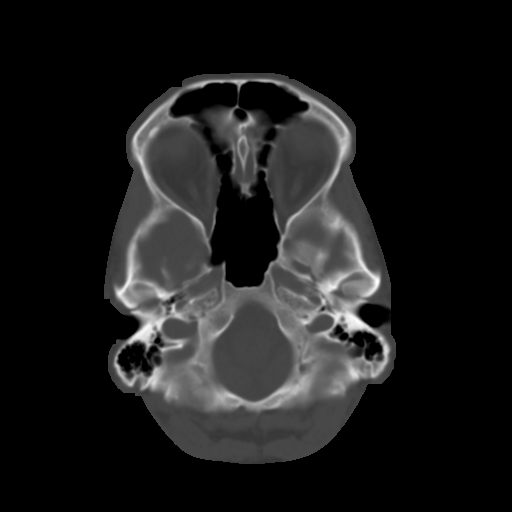
[im 6/31  brain]
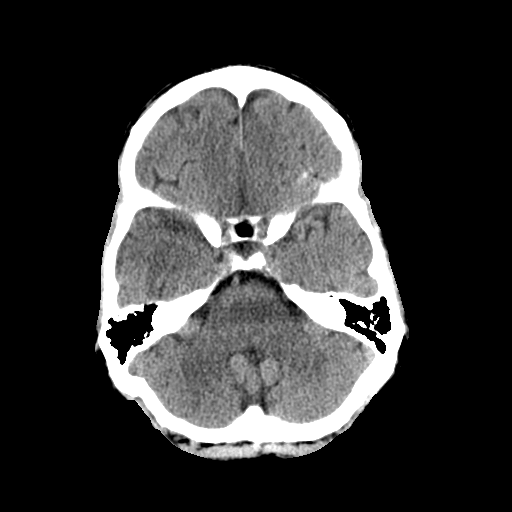
[im 9/31  brain]
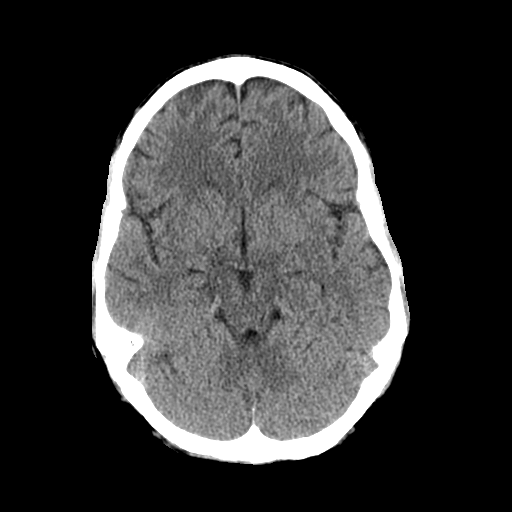
[im 12/31  brain]
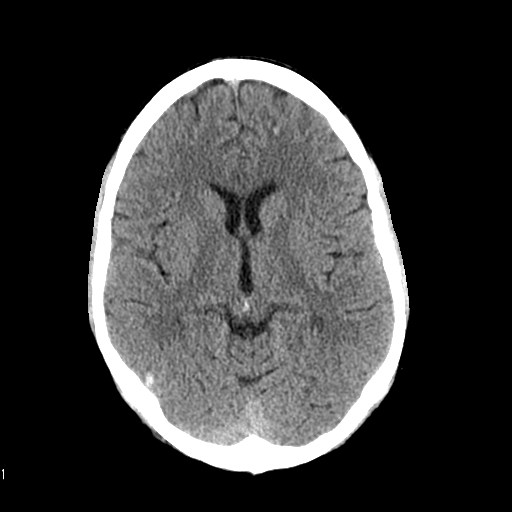
[im 16/31  brain]
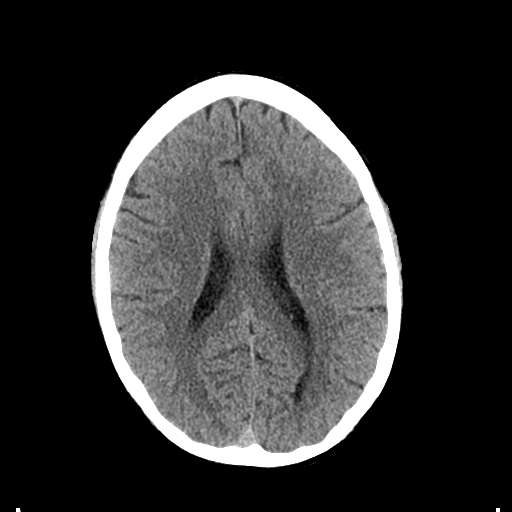
[im 16/31  bone]
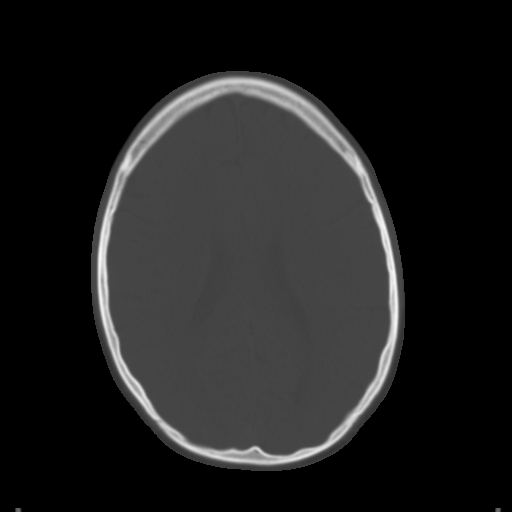
[im 19/31  brain]
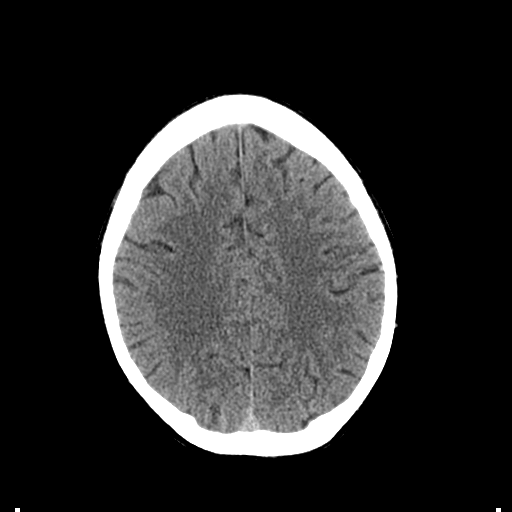
[im 22/31  brain]
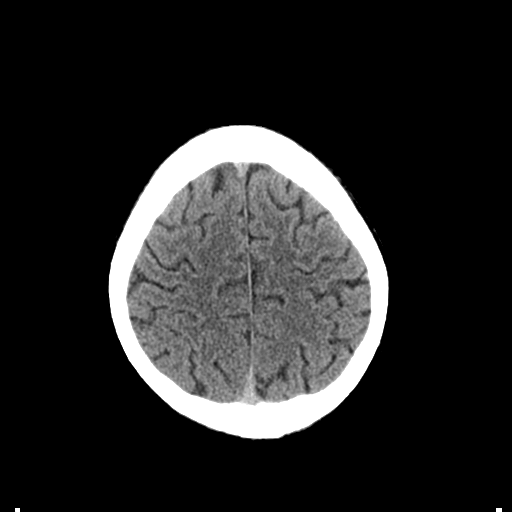
[im 25/31  brain]
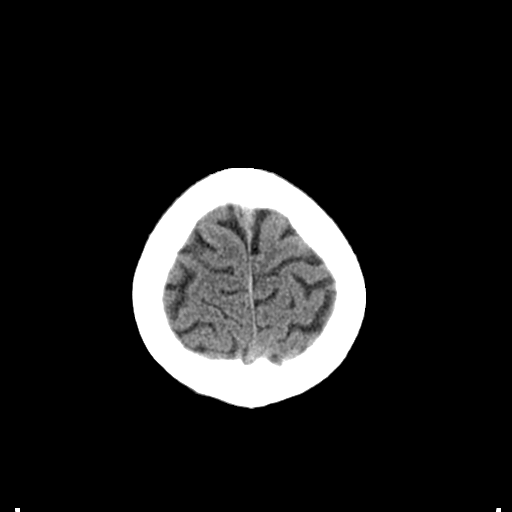
[im 28/31  brain]
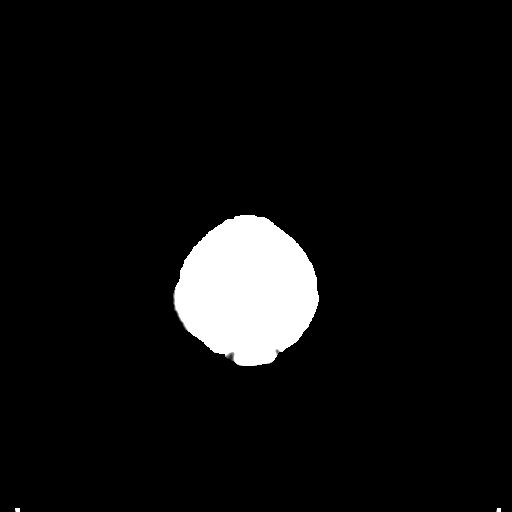
[im 28/31  bone]
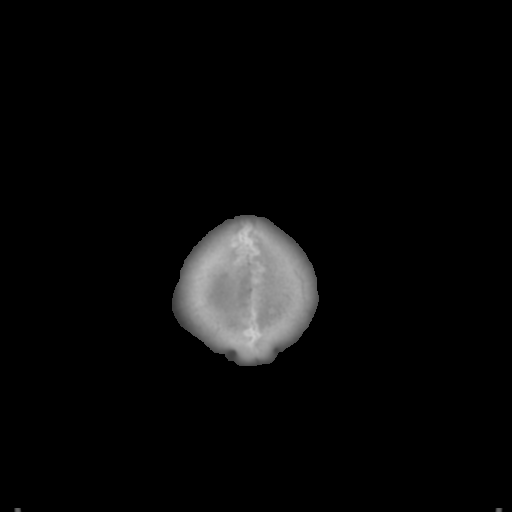

[Series 4: coronal soft tissue · coronal · 0.30mm/px · 3 of 67 slices shown]
[im 23/67  brain]
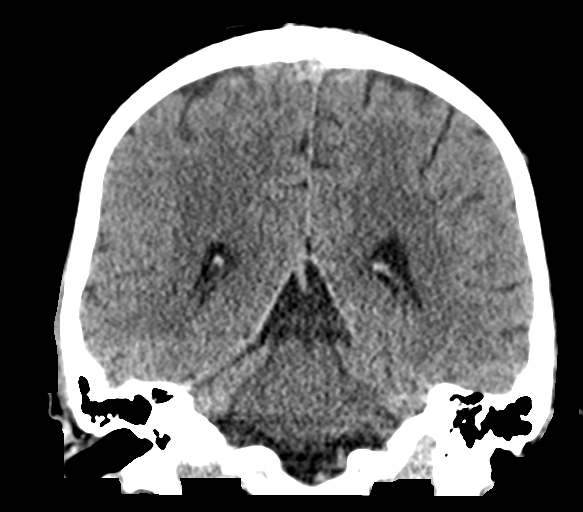
[im 30/67  brain]
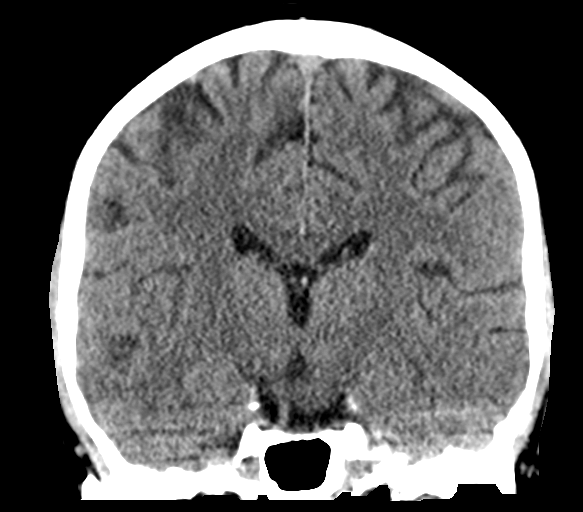
[im 37/67  brain]
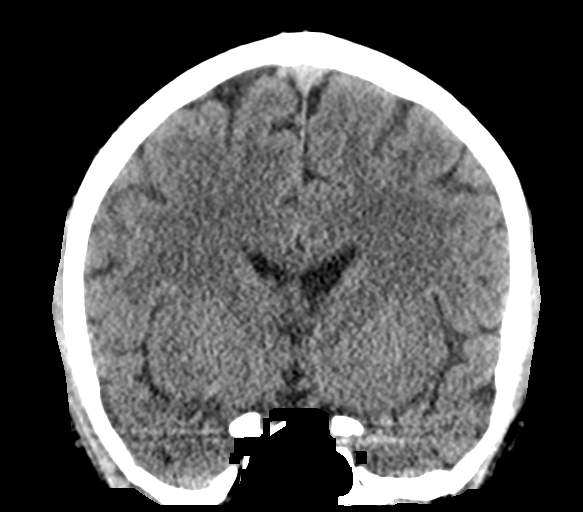

[Series 5: sagittal soft tissue · sagittal · 0.30mm/px · 3 of 57 slices shown]
[im 19/57  brain]
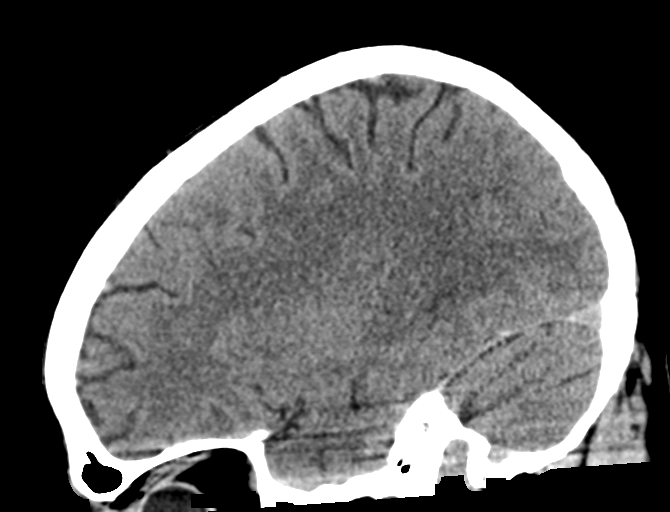
[im 29/57  brain]
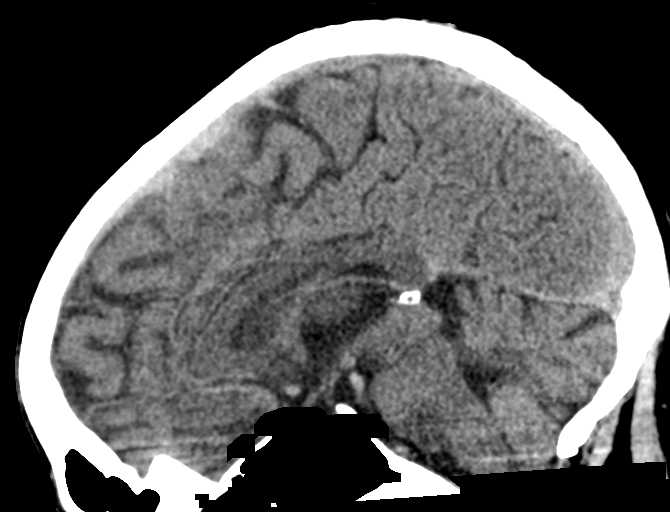
[im 38/57  brain]
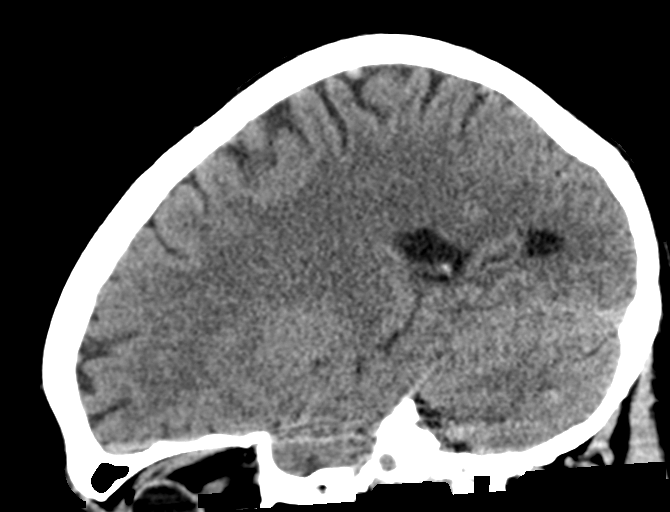

[15 of 47 positions shown; findings below may reference images not displayed]

FINDINGS: Brain: Multiple areas of subcortical hemorrhage again noted in the
left frontal lobe, right posterior parietal lobe and right occipital
lobe. These are stable when compared to prior study. No
hydrocephalus, mass effect or midline shift.

Vascular: No hyperdense vessel or unexpected calcification.

Skull: No acute calvarial abnormality.

Sinuses/Orbits: Visualized paranasal sinuses and mastoids clear.
Orbital soft tissues unremarkable.

Other: None
IMPRESSION: Areas of hemorrhage in the left frontal lobe, posterior right
parietal lobe and right occipital lobe are unchanged since prior
study.

## 2023-04-28 IMAGING — CR DG CHEST 2V
2 series · 2 of 2 positions shown · non-contrast
Comparison: Radiograph and CT 08/29/2020

CLINICAL DATA: Left-sided chest pain for 2-3 days.

EXAM:
CHEST - 2 VIEW

[chest lat]
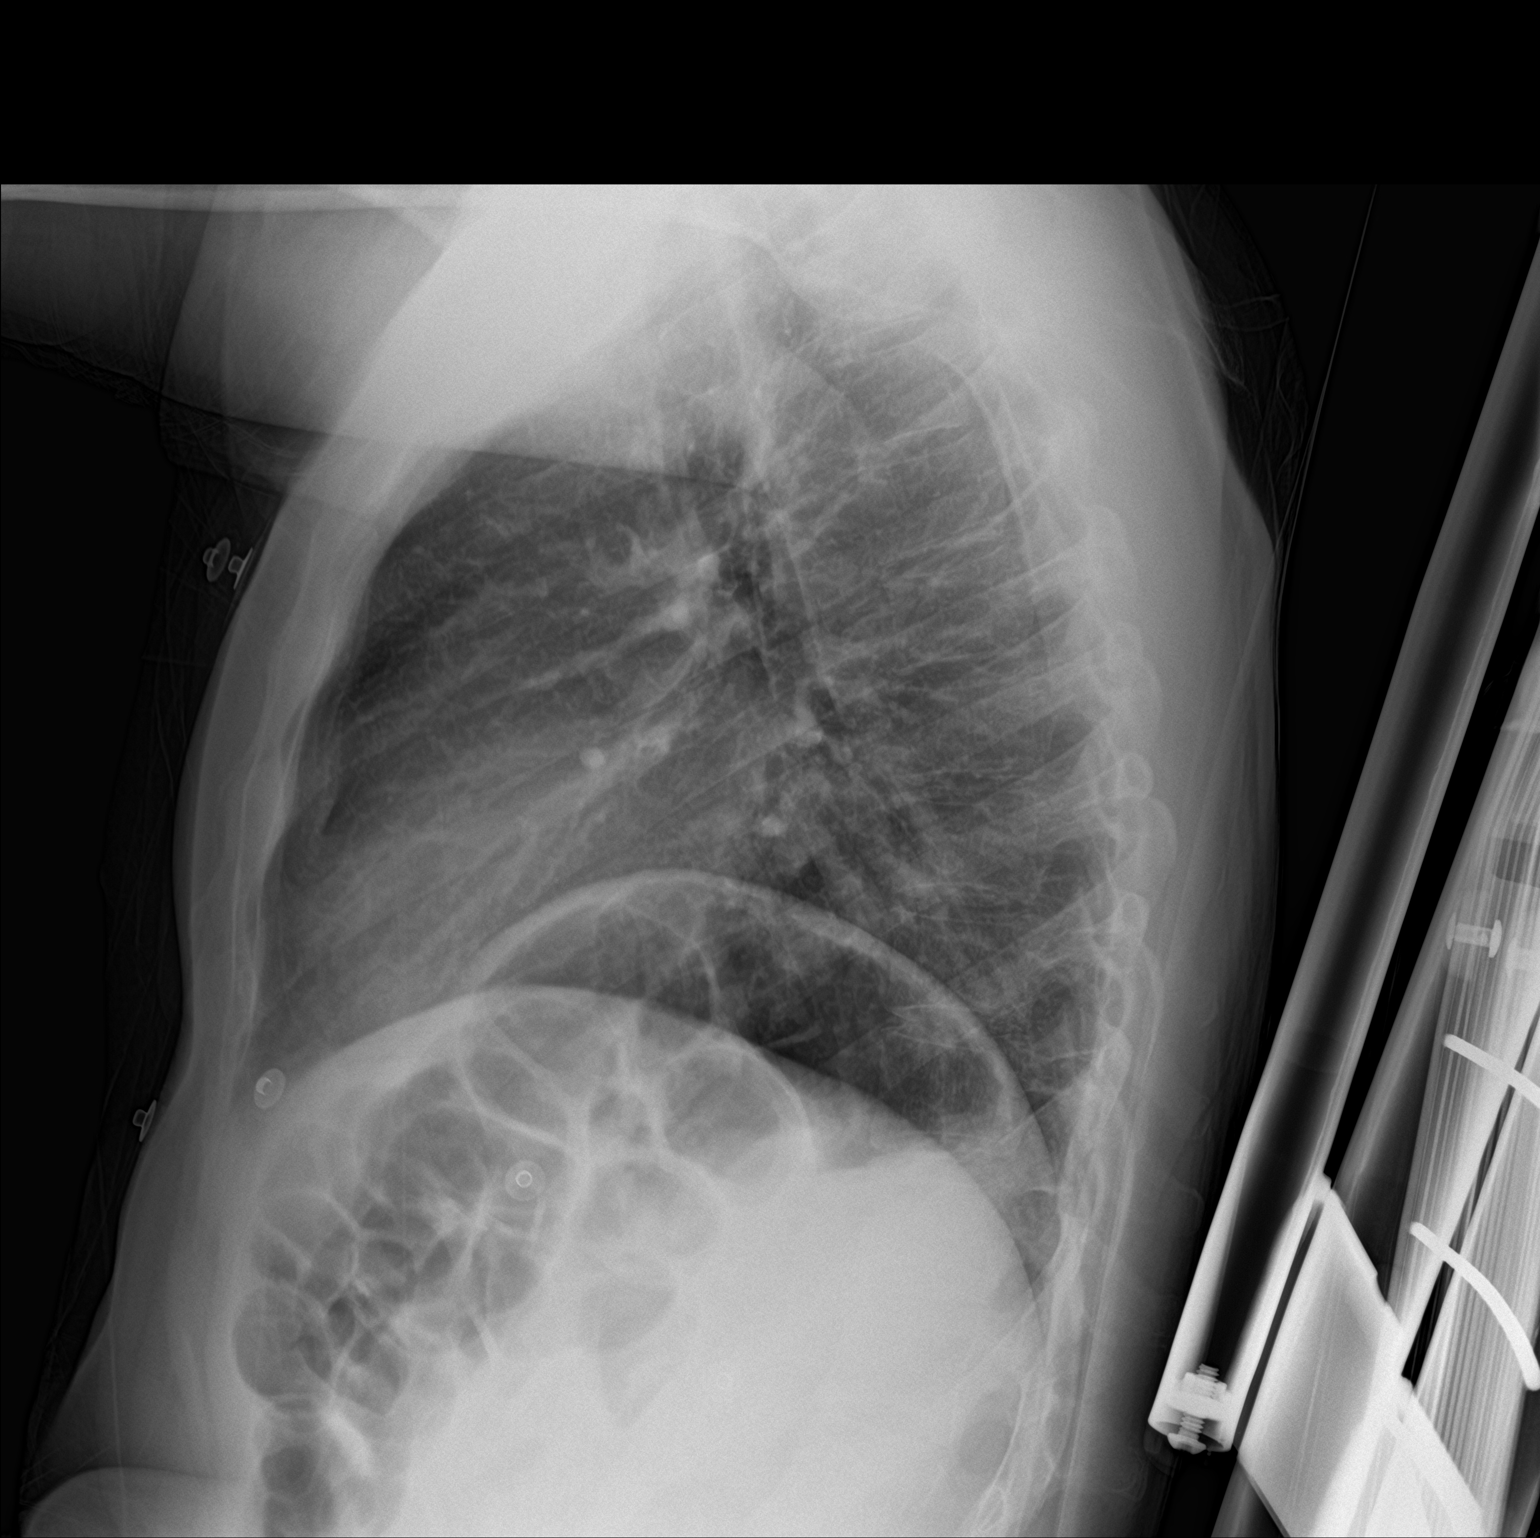

[chest ap]
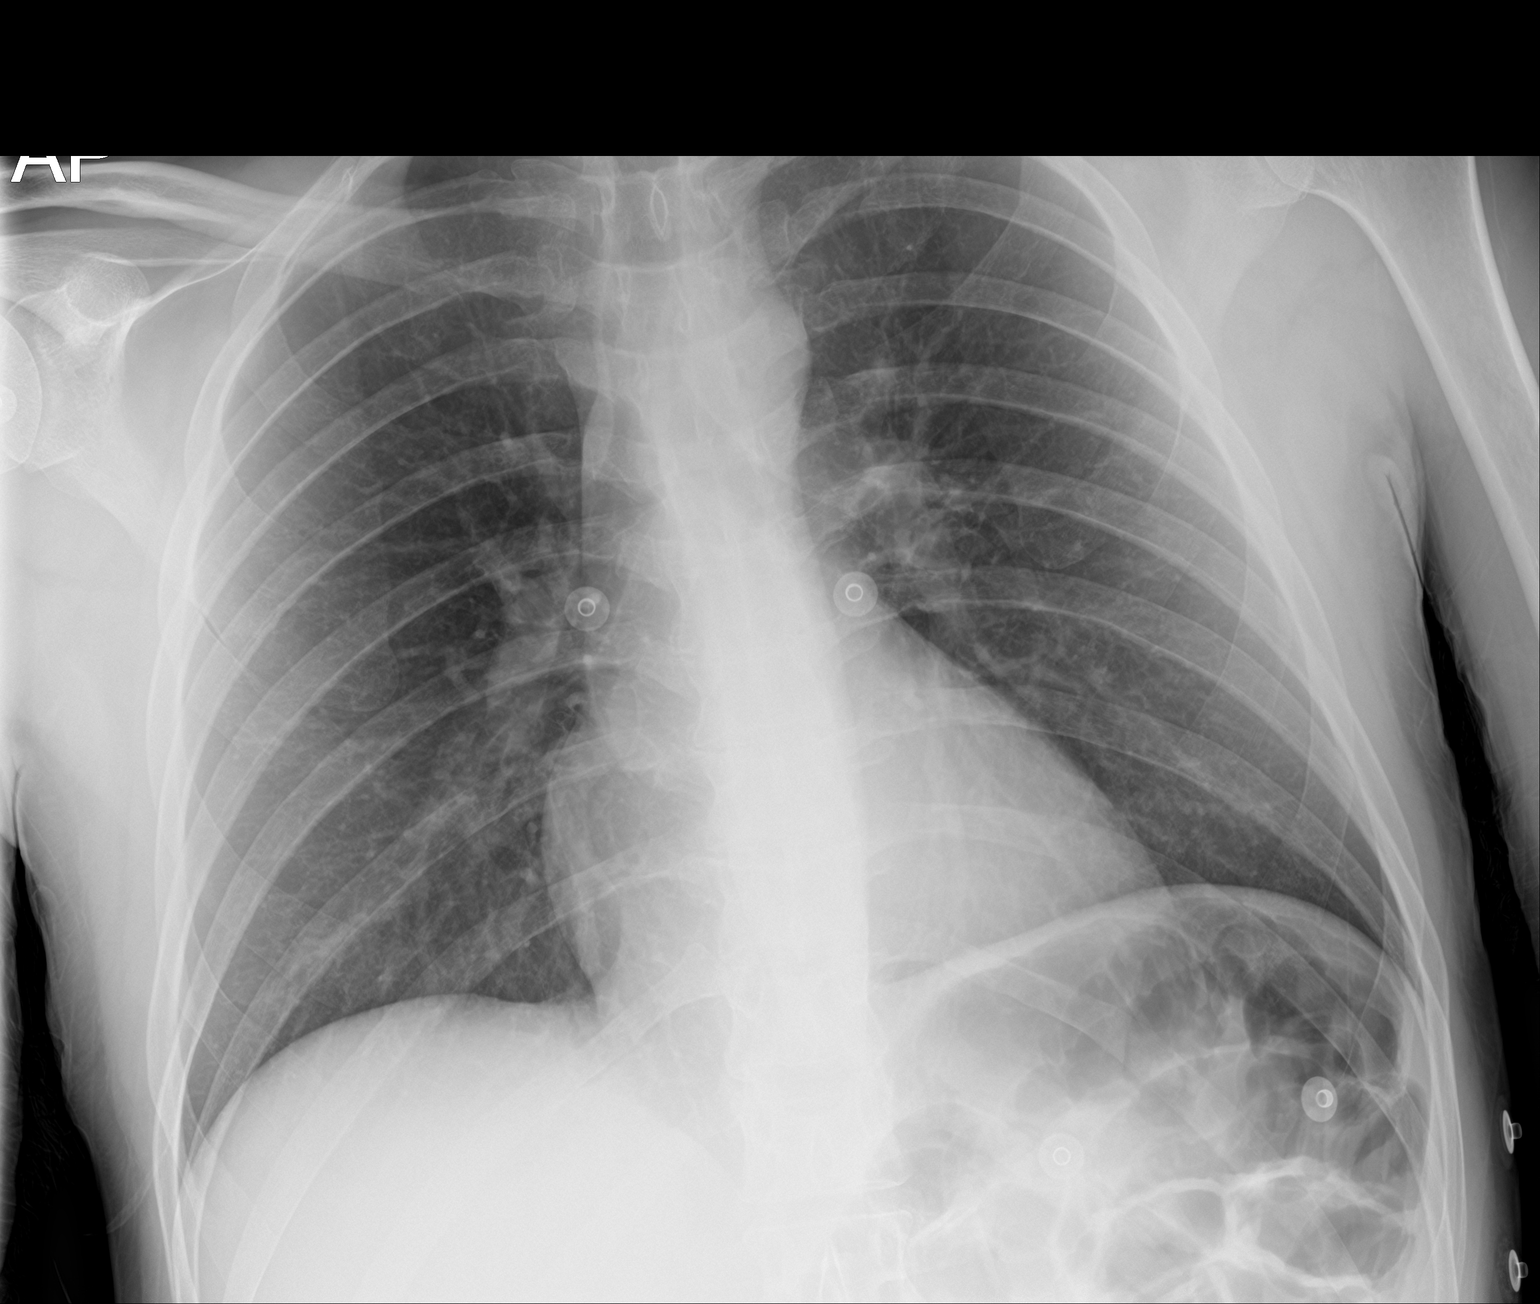

[2 of 2 positions shown; findings below may reference images not displayed]

FINDINGS: The cardiomediastinal contours are normal. The lungs are clear.
Pulmonary vasculature is normal. No consolidation, pleural effusion,
or pneumothorax. No acute osseous abnormalities are seen.
IMPRESSION: Negative radiographs of the chest.
